# Patient Record
Sex: Male | Born: 1950
Health system: Southern US, Community
[De-identification: ages and names within clinical notes are randomized; demographics above are authoritative.]

## PROBLEM LIST (undated history)

## (undated) DIAGNOSIS — R011 Cardiac murmur, unspecified: Secondary | ICD-10-CM

## (undated) DIAGNOSIS — I639 Cerebral infarction, unspecified: Secondary | ICD-10-CM

## (undated) DIAGNOSIS — N200 Calculus of kidney: Secondary | ICD-10-CM

## (undated) DIAGNOSIS — T7840XA Allergy, unspecified, initial encounter: Secondary | ICD-10-CM

## (undated) DIAGNOSIS — I10 Essential (primary) hypertension: Secondary | ICD-10-CM

## (undated) DIAGNOSIS — M1990 Unspecified osteoarthritis, unspecified site: Secondary | ICD-10-CM

## (undated) DIAGNOSIS — E785 Hyperlipidemia, unspecified: Secondary | ICD-10-CM

## (undated) HISTORY — PX: SPINE SURGERY: SHX786

## (undated) HISTORY — DX: Cerebral infarction, unspecified: I63.9

## (undated) HISTORY — DX: Calculus of kidney: N20.0

## (undated) HISTORY — DX: Cardiac murmur, unspecified: R01.1

## (undated) HISTORY — DX: Hyperlipidemia, unspecified: E78.5

## (undated) HISTORY — DX: Essential (primary) hypertension: I10

## (undated) HISTORY — DX: Unspecified osteoarthritis, unspecified site: M19.90

## (undated) HISTORY — DX: Allergy, unspecified, initial encounter: T78.40XA

---

## 1974-07-26 HISTORY — PX: APPENDECTOMY: SHX54

## 1988-07-26 HISTORY — PX: WRIST FRACTURE SURGERY: SHX121

## 2000-03-15 ENCOUNTER — Ambulatory Visit (HOSPITAL_BASED_OUTPATIENT_CLINIC_OR_DEPARTMENT_OTHER): Admission: RE | Admit: 2000-03-15 | Discharge: 2000-03-15 | Payer: Self-pay | Admitting: Orthopedic Surgery

## 2003-05-27 ENCOUNTER — Ambulatory Visit (HOSPITAL_COMMUNITY): Admission: RE | Admit: 2003-05-27 | Discharge: 2003-05-28 | Payer: Self-pay | Admitting: Neurosurgery

## 2006-07-26 DIAGNOSIS — I639 Cerebral infarction, unspecified: Secondary | ICD-10-CM

## 2006-07-26 HISTORY — DX: Cerebral infarction, unspecified: I63.9

## 2014-11-12 ENCOUNTER — Ambulatory Visit (INDEPENDENT_AMBULATORY_CARE_PROVIDER_SITE_OTHER): Payer: Medicare Other | Admitting: Family Medicine

## 2014-11-12 ENCOUNTER — Ambulatory Visit (INDEPENDENT_AMBULATORY_CARE_PROVIDER_SITE_OTHER): Payer: Medicare Other

## 2014-11-12 VITALS — BP 226/120 | HR 66 | Temp 98.1°F | Resp 20 | Ht 66.5 in | Wt 131.4 lb

## 2014-11-12 DIAGNOSIS — Z720 Tobacco use: Secondary | ICD-10-CM | POA: Diagnosis not present

## 2014-11-12 DIAGNOSIS — R42 Dizziness and giddiness: Secondary | ICD-10-CM | POA: Diagnosis not present

## 2014-11-12 DIAGNOSIS — I10 Essential (primary) hypertension: Secondary | ICD-10-CM

## 2014-11-12 DIAGNOSIS — Z8673 Personal history of transient ischemic attack (TIA), and cerebral infarction without residual deficits: Secondary | ICD-10-CM | POA: Diagnosis not present

## 2014-11-12 DIAGNOSIS — F17200 Nicotine dependence, unspecified, uncomplicated: Secondary | ICD-10-CM | POA: Insufficient documentation

## 2014-11-12 DIAGNOSIS — M720 Palmar fascial fibromatosis [Dupuytren]: Secondary | ICD-10-CM | POA: Insufficient documentation

## 2014-11-12 LAB — POCT UA - MICROSCOPIC ONLY
Casts, Ur, LPF, POC: NEGATIVE
Crystals, Ur, HPF, POC: NEGATIVE
Mucus, UA: NEGATIVE
Yeast, UA: NEGATIVE

## 2014-11-12 LAB — POCT URINALYSIS DIPSTICK
Bilirubin, UA: NEGATIVE
Glucose, UA: NEGATIVE
Ketones, UA: NEGATIVE
Leukocytes, UA: NEGATIVE
Nitrite, UA: NEGATIVE
Protein, UA: 100
Spec Grav, UA: 1.02
Urobilinogen, UA: 0.2
pH, UA: 7

## 2014-11-12 MED ORDER — LISINOPRIL-HYDROCHLOROTHIAZIDE 20-12.5 MG PO TABS: 1.0000 | ORAL_TABLET | Freq: Every day | ORAL | Status: DC

## 2014-11-12 MED ORDER — AMLODIPINE BESYLATE 5 MG PO TABS: 5.0000 mg | ORAL_TABLET | Freq: Every day | ORAL | Status: DC

## 2014-11-12 NOTE — Patient Instructions (Addendum)
Take amlodipine one every evening  Take losartan HCT one this evening and then every morning  Monitor your blood pressure. If not less than 180/100 by Friday or Saturday please return to get rechecked here. If it is staying less than 180/100 you can wait and see me on Monday  In the event of chest pain, shortness of breath, more dizziness, or other major symptoms go straight to the emergency room or call 911

## 2014-11-12 NOTE — Progress Notes (Signed)
Subjective: 64 year old man who is here because he's been monitoring his blood pressure on outpatient basis and has been getting numbers over 200 and over 110. He has been running high blood pressures for a long time with systolic greater than 863. He does not know when he got anything lower in a long time. He has a history of having had high blood pressure and a little stroke about 8 years ago. He was hospitalized in [facility]. He lost his job and insurance so he quit taking his medicine. He never followed up and has ignored this for long time. He smokes about a pack cigarettes a day. He does not have any history of diabetes. He his not been having chest pains. He does have some shortness of breath. He works regularly. Results for orders placed or performed in visit on 11/12/14  POCT UA - Microscopic Only  Result Value Ref Range   WBC, Ur, HPF, POC 3-5    RBC, urine, microscopic 8-12    Bacteria, U Microscopic trace    Mucus, UA neg    Epithelial cells, urine per micros 1-3    Crystals, Ur, HPF, POC neg    Casts, Ur, LPF, POC neg    Yeast, UA neg   POCT urinalysis dipstick  Result Value Ref Range   Color, UA yellow    Clarity, UA clear    Glucose, UA neg    Bilirubin, UA neg    Ketones, UA neg    Spec Grav, UA 1.020    Blood, UA moderate    pH, UA 7.0    Protein, UA 100    Urobilinogen, UA 0.2    Nitrite, UA neg    Leukocytes, UA Negative    [facility] reading (PRIMARY) by  Dr. Linna Darner Possible COPD with long chest silhouette. Normal cardiac size  EKG no acute changes  Treat with amlodipine and lisinopril HCT  Long discussion about quitting smoking. Marland Kitchen

## 2014-11-13 ENCOUNTER — Encounter: Payer: Self-pay | Admitting: Family Medicine

## 2014-11-13 LAB — TSH: TSH: 0.999 u[IU]/mL (ref 0.350–4.500)

## 2014-11-13 LAB — COMPREHENSIVE METABOLIC PANEL
ALT: 18 U/L (ref 0–53)
AST: 20 U/L (ref 0–37)
Albumin: 4.4 g/dL (ref 3.5–5.2)
Alkaline Phosphatase: 61 U/L (ref 39–117)
BUN: 19 mg/dL (ref 6–23)
CO2: 26 mEq/L (ref 19–32)
Calcium: 10.3 mg/dL (ref 8.4–10.5)
Chloride: 105 mEq/L (ref 96–112)
Creat: 1.22 mg/dL (ref 0.50–1.35)
Glucose, Bld: 93 mg/dL (ref 70–99)
Potassium: 4.4 mEq/L (ref 3.5–5.3)
Sodium: 137 mEq/L (ref 135–145)
Total Bilirubin: 1 mg/dL (ref 0.2–1.2)
Total Protein: 7.2 g/dL (ref 6.0–8.3)

## 2014-11-13 LAB — LIPID PANEL
Cholesterol: 205 mg/dL — ABNORMAL HIGH (ref 0–200)
HDL: 41 mg/dL (ref >=40)
LDL Cholesterol: 133 mg/dL — ABNORMAL HIGH (ref 0–99)
Total CHOL/HDL Ratio: 5 Ratio
Triglycerides: 153 mg/dL — ABNORMAL HIGH (ref <150)
VLDL: 31 mg/dL (ref 0–40)

## 2014-11-16 ENCOUNTER — Ambulatory Visit (INDEPENDENT_AMBULATORY_CARE_PROVIDER_SITE_OTHER): Payer: Medicare Other | Admitting: Family Medicine

## 2014-11-16 VITALS — BP 168/88 | HR 57 | Temp 98.2°F | Resp 16 | Ht 68.0 in | Wt 131.8 lb

## 2014-11-16 DIAGNOSIS — M6530 Trigger finger, unspecified finger: Secondary | ICD-10-CM | POA: Diagnosis not present

## 2014-11-16 DIAGNOSIS — I10 Essential (primary) hypertension: Secondary | ICD-10-CM | POA: Diagnosis not present

## 2014-11-16 NOTE — Patient Instructions (Signed)
Hypertension Hypertension, commonly called high blood pressure, is when the force of blood pumping through your arteries is too strong. Your arteries are the blood vessels that carry blood from your heart throughout your body. A blood pressure reading consists of a higher number over a lower number, such as 110/72. The higher number (systolic) is the pressure inside your arteries when your heart pumps. The lower number (diastolic) is the pressure inside your arteries when your heart relaxes. Ideally you want your blood pressure below 120/80. Hypertension forces your heart to work harder to pump blood. Your arteries may become narrow or stiff. Having hypertension puts you at risk for heart disease, stroke, and other problems.  RISK FACTORS Some risk factors for high blood pressure are controllable. Others are not.  Risk factors you cannot control include:   Race. You may be at higher risk if you are African American.  Age. Risk increases with age.  Gender. Men are at higher risk than women before age 45 years. After age 65, women are at higher risk than men. Risk factors you can control include:  Not getting enough exercise or physical activity.  Being overweight.  Getting too much fat, sugar, calories, or salt in your diet.  Drinking too much alcohol. SIGNS AND SYMPTOMS Hypertension does not usually cause signs or symptoms. Extremely high blood pressure (hypertensive crisis) may cause headache, anxiety, shortness of breath, and nosebleed. DIAGNOSIS  To check if you have hypertension, your health care provider will measure your blood pressure while you are seated, with your arm held at the level of your heart. It should be measured at least twice using the same arm. Certain conditions can cause a difference in blood pressure between your right and left arms. A blood pressure reading that is higher than normal on one occasion does not mean that you need treatment. If one blood pressure reading  is high, ask your health care provider about having it checked again. TREATMENT  Treating high blood pressure includes making lifestyle changes and possibly taking medicine. Living a healthy lifestyle can help lower high blood pressure. You may need to change some of your habits. Lifestyle changes may include:  Following the DASH diet. This diet is high in fruits, vegetables, and whole grains. It is low in salt, red meat, and added sugars.  Getting at least 2 hours of brisk physical activity every week.  Losing weight if necessary.  Not smoking.  Limiting alcoholic beverages.  Learning ways to reduce stress. If lifestyle changes are not enough to get your blood pressure under control, your health care provider may prescribe medicine. You may need to take more than one. Work closely with your health care provider to understand the risks and benefits. HOME CARE INSTRUCTIONS  Have your blood pressure rechecked as directed by your health care provider.   Take medicines only as directed by your health care provider. Follow the directions carefully. Blood pressure medicines must be taken as prescribed. The medicine does not work as well when you skip doses. Skipping doses also puts you at risk for problems.   Do not smoke.   Monitor your blood pressure at home as directed by your health care provider. SEEK MEDICAL CARE IF:   You think you are having a reaction to medicines taken.  You have recurrent headaches or feel dizzy.  You have swelling in your ankles.  You have trouble with your vision. SEEK IMMEDIATE MEDICAL CARE IF:  You develop a severe headache or confusion.    You have unusual weakness, numbness, or feel faint.  You have severe chest or abdominal pain.  You vomit repeatedly.  You have trouble breathing. MAKE SURE YOU:   Understand these instructions.  Will watch your condition.  Will get help right away if you are not doing well or get worse. Document  Released: 07/12/2005 Document Revised: 11/26/2013 Document Reviewed: 05/04/2013 [employer] Patient Information 2015 [place], [place]. This information is not intended to replace advice given to you by your health care provider. Make sure you discuss any questions you have with your health care provider. Trigger Finger Trigger finger (digital tendinitis and stenosing tenosynovitis) is a common disorder that causes an often painful catching of the fingers or thumb. It occurs as a clicking, snapping, or locking of a finger in the palm of the hand. This is caused by a problem with the tendons that flex or bend the fingers sliding smoothly through their sheaths. The condition may occur in any finger or a couple fingers at the same time.  The finger may lock with the finger curled or suddenly straighten out with a snap. This is more common in patients with rheumatoid arthritis and diabetes. Left untreated, the condition may get worse to the point where the finger becomes locked in flexion, like making a fist, or less commonly locked with the finger straightened out. CAUSES   Inflammation and scarring that lead to swelling around the tendon sheath.  Repeated or forceful movements.  Rheumatoid arthritis, an autoimmune disease that affects joints.  Gout.  Diabetes mellitus. SIGNS AND SYMPTOMS  Soreness and swelling of your finger.  A painful clicking or snapping as you bend and straighten your finger. DIAGNOSIS  Your health care provider will do a physical exam of your finger to diagnose trigger finger. TREATMENT   Splinting for 6-8 weeks may be helpful.  Nonsteroidal anti-inflammatory medicines (NSAIDs) can help to relieve the pain and inflammation.  Cortisone injections, along with splinting, may speed up recovery. Several injections may be required. Cortisone may give relief after one injection.  Surgery is another treatment that may be used if conservative treatments do not work. Surgery can  be minor, without incisions (a cut does not have to be made), and can be done with a needle through the skin.  Other surgical choices involve an open procedure in which the surgeon opens the hand through a small incision and cuts the pulley so the tendon can again slide smoothly. Your hand will still work fine. HOME CARE INSTRUCTIONS  Apply ice to the injured area, twice per day:  Put ice in a plastic bag.  Place a towel between your skin and the bag.  Leave the ice on for 20 minutes, 3-4 times a day.  Rest your hand often. MAKE SURE YOU:   Understand these instructions.  Will watch your condition.  Will get help right away if you are not doing well or get worse. Document Released: 05/01/2004 Document Revised: 03/14/2013 Document Reviewed: 12/12/2012 [facility] Patient Information 2015 [place], [place]. This information is not intended to replace advice given to you by your health care provider. Make sure you discuss any questions you have with your health care provider.

## 2014-11-16 NOTE — Progress Notes (Signed)
Patient ID: Jeffery Taylor MRN: [id number], DOB: 07/16/1951, 64 y.o. Date of Encounter: 11/16/2014, 9:36 AM  Primary Physician: No PCP Per Patient  Chief Complaint: HTN  HPI: 64 y.o. year old male with history below presents for hypertension follow up.  His blood pressure was elevated on Tuesday and he was started on blood pressure medication He has a h/o stroke from which he recovered He works in [employer] and hopes to retire in a year He also has hand problem with left middle finger trigger finger.  No CP, HA, visual changes, or focal deficits.   Past Medical History  Diagnosis Date  . Stroke   . Hypertension      Home Meds: Prior to Admission medications   Medication Sig Start Date End Date Taking? Authorizing Provider  amLODipine (NORVASC) 5 MG tablet Take 1 tablet (5 mg total) by mouth daily. 11/12/14  Yes Posey Boyer, MD  lisinopril-hydrochlorothiazide (ZESTORETIC) 20-12.5 MG per tablet Take 1 tablet by mouth daily. 11/12/14  Yes Posey Boyer, MD    Allergies: No Known Allergies  History   Social History  . Marital Status: Married    Spouse Name: N/A  . Number of Children: N/A  . Years of Education: N/A   Occupational History  . Not on file.   Social History Main Topics  . Smoking status: Current Every Day Smoker -- 1.00 packs/day    Types: Cigarettes  . Smokeless tobacco: Never Used  . Alcohol Use: No  . Drug Use: No  . Sexual Activity: Not on file   Other Topics Concern  . Not on file   Social History Narrative     History reviewed. No pertinent family history.  Review of Systems: Constitutional: negative for chills, fever, night sweats, weight changes, or fatigue  HEENT: negative for vision changes, hearing loss, congestion, rhinorrhea, ST, epistaxis, or sinus pressure Cardiovascular: negative for chest pain, palpitations, or DOE Respiratory: negative for hemoptysis, wheezing, shortness of breath, or cough Abdominal: negative for  abdominal pain, nausea, vomiting, diarrhea, or constipation Dermatological: negative for rash Neurologic: negative for headache, dizziness, or syncope All other systems reviewed and are otherwise negative with the exception to those above and in the HPI.   Physical Exam: Blood pressure 168/88, pulse 57, temperature 98.2 F (36.8 C), temperature source Oral, resp. rate 16, height 5\' 8"  (1.727 m), weight 131 lb 12.8 oz (59.784 kg), SpO2 99 %., Body mass index is 20.04 kg/(m^2). BP Readings from Last 3 Encounters:  11/16/14 168/88  11/12/14 226/120   Wt Readings from Last 3 Encounters:  11/16/14 131 lb 12.8 oz (59.784 kg)  11/12/14 131 lb 6 oz (59.591 kg)   General: Well developed, well nourished, in no acute distress.  BP recheck 158/88 Head: Normocephalic, atraumatic, eyes without discharge, sclera non-icteric, nares are without discharge. Bilateral auditory canals clear, TM's are without perforation, pearly grey and translucent with reflective cone of light bilaterally. Oral cavity moist, posterior pharynx without exudate, erythema, peritonsillar abscess, or post nasal drip.  Neck: Supple. No thyromegaly. Full ROM. No lymphadenopathy. No carotid bruits. Lungs: Clear bilaterally to auscultation without wheezes, rales, or rhonchi. Breathing is unlabored. Heart: RRR with S1 S2. No murmurs, rubs, or gallops appreciated.  Abdomen: Soft, non-tender, non-distended with normoactive bowel sounds. No hepatosplenomegaly. No rebound/guarding. No obvious abdominal masses. Msk:  Strength and tone normal for age.  Trigger finger right middle finger. Extremities/Skin: Warm and dry. No clubbing or cyanosis. No edema. No rashes or suspicious lesions.  Distal pulses 2+ and equal bilaterally. Neuro: Alert and oriented X 3. Moves all extremities spontaneously. Gait is normal. CNII-XII grossly in tact. DTR 2+, cerebellar function intact. Rhomberg normal. Psych:  Responds to questions appropriately with a normal  affect.   ASSESSMENT AND PLAN:  64 y.o. year old male with hypertension and trigger finger Continue the current medications Ortho referral for trigger finger -  Signed, Robyn Haber, MD 11/16/2014 9:36 AM

## 2014-11-29 ENCOUNTER — Ambulatory Visit (INDEPENDENT_AMBULATORY_CARE_PROVIDER_SITE_OTHER): Payer: Medicare Other | Admitting: Physician Assistant

## 2014-11-29 VITALS — BP 190/110 | HR 77 | Temp 98.3°F | Resp 16 | Ht 68.0 in | Wt 132.8 lb

## 2014-11-29 DIAGNOSIS — M6530 Trigger finger, unspecified finger: Secondary | ICD-10-CM | POA: Diagnosis not present

## 2014-11-29 DIAGNOSIS — I10 Essential (primary) hypertension: Secondary | ICD-10-CM | POA: Diagnosis not present

## 2014-11-29 NOTE — Progress Notes (Signed)
Procedure note: Informed consent obtained After demonstrating the triggering phenomenon of the middle finger left hand at the palmar aspect of the flexor tendon close to the MCP, sterile field was obtained and he was injected with half cc 2% lidocaine plus quarter cc D 80 The area was massaged and he was buddy taped to finger #4 Postop care was described He will be referred to orthopedics for probable surgery/definitive treatment

## 2014-11-29 NOTE — Progress Notes (Signed)
   Subjective:    Patient ID: Jeffery Taylor, male    DOB: September 13, 1950, 64 y.o.   MRN: [id number]  Chief Complaint  Patient presents with  . Hand Pain    Left   Patient Active Problem List   Diagnosis Date Noted  . Malignant hypertension 11/12/2014  . Tobacco use disorder 11/12/2014  . Dupuytren's contracture of left hand 11/12/2014  . History of stroke 11/12/2014   Prior to Admission medications   Medication Sig Start Date End Date Taking? Authorizing Provider  amLODipine (NORVASC) 5 MG tablet Take 1 tablet (5 mg total) by mouth daily. 11/12/14  Yes Posey Boyer, MD  lisinopril-hydrochlorothiazide (ZESTORETIC) 20-12.5 MG per tablet Take 1 tablet by mouth daily. 11/12/14  Yes Posey Boyer, MD   No chest pain, SOB, HA, dizziness, vision change, N/V, diarrhea, constipation, dysuria, urinary urgency or frequency, myalgias, arthralgias or rash.  HPI  7 yom with pmh htn and left middle finger trigger finger presents with recurrence of trigger finger.   BP elevated today at 190/110. Was seen here 11/12/14 bp elevated 226/120. Started on amlodipine 5 and lisinopril/hctz 20/12.5. Was seen again 11/16/14, bp better controlled at 168/88 then retaken 158/88. Today states has been taking both his meds as prescribed. Checks bp at home typically running 150s/80s. Denies cp, sob, palps, presyncope, syncope, vision changes, persistent HA.   Has hx trigger finger several fingers left hand. Has had injections in past. When was here 11/16/14 Dr L referred him to ortho for his trigger finger. Notes show that ortho tried to conatct him but his phone wasn't set up. He is still interested in the referral because his left middle finger continues to lock, approx 15 time per day. He buddy tapes his fingers at home.   Review of Systems See HPI.     Objective:   Physical Exam  Constitutional: He is oriented to person, place, and time. He appears well-developed and well-nourished.  Non-toxic appearance. He does  not have a sickly appearance. He does not appear ill. No distress.  BP 190/110 mmHg  Pulse 77  Temp(Src) 98.3 F (36.8 C) (Oral)  Resp 16  Ht 5\' 8"  (1.727 m)  Wt 132 lb 12.8 oz (60.238 kg)  BMI 20.20 kg/m2  SpO2 98%   Musculoskeletal:       Hands: Inflammation noted mcp joint left middle digit. Tendon catching palpable at this joint with flexion. Normal sensation, normal cap refill.   Neurological: He is alert and oriented to person, place, and time.      Assessment & Plan:   18 yom with pmh htn and left middle finger trigger finger presents with recurrence of trigger finger.   Trigger finger, acquired --1cc injection steroid/lido into tendon area as per procedure note --pt will be re referred to ortho with proper phone number --buddy taped today, leave for 2-3 days, ice few times day next few days, tylenol prn  Essential hypertension --continues to be elevated --increase amlodipine from 5 to 10 mg qd, will refill at 10 mg dose --instructed to rtc if bp at home running above 150/90   Julieta Gutting, PA-C Physician Assistant-Certified [facility]  11/29/2014 9:48 AM

## 2014-11-29 NOTE — Patient Instructions (Signed)
Your blood pressure was still elevated today. Please start taking the amlodipine 10 mg every morning along with your afternoon blood pressure medication.  When you need refills of the amlodipine we will likely fill 10 mg at a time.  If your blood pressure continues to run greater than 150/90 please let us know. For your trigger finger, please avoid NSAIDs and take tylenol for the pain.  We will have ortho contact your correct number for a possible appointment. You may require surgery for this in the future.  Keep the buddy taping on for the next few days. Apply ice a few times a day for the next few days.   Hypertension Hypertension, commonly called high blood pressure, is when the force of blood pumping through your arteries is too strong. Your arteries are the blood vessels that carry blood from your heart throughout your body. A blood pressure reading consists of a higher number over a lower number, such as 110/72. The higher number (systolic) is the pressure inside your arteries when your heart pumps. The lower number (diastolic) is the pressure inside your arteries when your heart relaxes. Ideally you want your blood pressure below 120/80. Hypertension forces your heart to work harder to pump blood. Your arteries may become narrow or stiff. Having hypertension puts you at risk for heart disease, stroke, and other problems.  RISK FACTORS Some risk factors for high blood pressure are controllable. Others are not.  Risk factors you cannot control include:   Race. You may be at higher risk if you are African American.  Age. Risk increases with age.  Gender. Men are at higher risk than women before age 110 years. After age 34, women are at higher risk than men. Risk factors you can control include:  Not getting enough exercise or physical activity.  Being overweight.  Getting too much fat, sugar, calories, or salt in your diet.  Drinking too much alcohol. SIGNS AND SYMPTOMS Hypertension  does not usually cause signs or symptoms. Extremely high blood pressure (hypertensive crisis) may cause headache, anxiety, shortness of breath, and nosebleed. DIAGNOSIS  To check if you have hypertension, your health care provider will measure your blood pressure while you are seated, with your arm held at the level of your heart. It should be measured at least twice using the same arm. Certain conditions can cause a difference in blood pressure between your right and left arms. A blood pressure reading that is higher than normal on one occasion does not mean that you need treatment. If one blood pressure reading is high, ask your health care provider about having it checked again. TREATMENT  Treating high blood pressure includes making lifestyle changes and possibly taking medicine. Living a healthy lifestyle can help lower high blood pressure. You may need to change some of your habits. Lifestyle changes may include:  Following the DASH diet. This diet is high in fruits, vegetables, and whole grains. It is low in salt, red meat, and added sugars.  Getting at least 2 hours of brisk physical activity every week.  Losing weight if necessary.  Not smoking.  Limiting alcoholic beverages.  Learning ways to reduce stress. If lifestyle changes are not enough to get your blood pressure under control, your health care provider may prescribe medicine. You may need to take more than one. Work closely with your health care provider to understand the risks and benefits. HOME CARE INSTRUCTIONS  Have your blood pressure rechecked as directed by your health care provider.  Take medicines only as directed by your health care provider. Follow the directions carefully. Blood pressure medicines must be taken as prescribed. The medicine does not work as well when you skip doses. Skipping doses also puts you at risk for problems.   Do not smoke.   Monitor your blood pressure at home as directed by your  health care provider. SEEK MEDICAL CARE IF:   You think you are having a reaction to medicines taken.  You have recurrent headaches or feel dizzy.  You have swelling in your ankles.  You have trouble with your vision. SEEK IMMEDIATE MEDICAL CARE IF:  You develop a severe headache or confusion.  You have unusual weakness, numbness, or feel faint.  You have severe chest or abdominal pain.  You vomit repeatedly.  You have trouble breathing. MAKE SURE YOU:   Understand these instructions.  Will watch your condition.  Will get help right away if you are not doing well or get worse. Document Released: 07/12/2005 Document Revised: 11/26/2013 Document Reviewed: 05/04/2013 [facility] Patient Information 2015 [place], [place]. This information is not intended to replace advice given to you by your health care provider. Make sure you discuss any questions you have with your health care provider.    Trigger Finger Trigger finger (digital tendinitis and stenosing tenosynovitis) is a common disorder that causes an often painful catching of the fingers or thumb. It occurs as a clicking, snapping, or locking of a finger in the palm of the hand. This is caused by a problem with the tendons that flex or bend the fingers sliding smoothly through their sheaths. The condition may occur in any finger or a couple fingers at the same time.  The finger may lock with the finger curled or suddenly straighten out with a snap. This is more common in patients with rheumatoid arthritis and diabetes. Left untreated, the condition may get worse to the point where the finger becomes locked in flexion, like making a fist, or less commonly locked with the finger straightened out. CAUSES   Inflammation and scarring that lead to swelling around the tendon sheath.  Repeated or forceful movements.  Rheumatoid arthritis, an autoimmune disease that affects joints.  Gout.  Diabetes mellitus. SIGNS AND  SYMPTOMS  Soreness and swelling of your finger.  A painful clicking or snapping as you bend and straighten your finger. DIAGNOSIS  Your health care provider will do a physical exam of your finger to diagnose trigger finger. TREATMENT   Splinting for 6-8 weeks may be helpful.  Nonsteroidal anti-inflammatory medicines (NSAIDs) can help to relieve the pain and inflammation.  Cortisone injections, along with splinting, may speed up recovery. Several injections may be required. Cortisone may give relief after one injection.  Surgery is another treatment that may be used if conservative treatments do not work. Surgery can be minor, without incisions (a cut does not have to be made), and can be done with a needle through the skin.  Other surgical choices involve an open procedure in which the surgeon opens the hand through a small incision and cuts the pulley so the tendon can again slide smoothly. Your hand will still work fine. HOME CARE INSTRUCTIONS  Apply ice to the injured area, twice per day:  Put ice in a plastic bag.  Place a towel between your skin and the bag.  Leave the ice on for 20 minutes, 3-4 times a day.  Rest your hand often. MAKE SURE YOU:   Understand these instructions.  Will  watch your condition.  Will get help right away if you are not doing well or get worse. Document Released: 05/01/2004 Document Revised: 03/14/2013 Document Reviewed: 12/12/2012 [facility] Patient Information 2015 [place], [place]. This information is not intended to replace advice given to you by your health care provider. Make sure you discuss any questions you have with your health care provider.

## 2015-01-16 ENCOUNTER — Ambulatory Visit: Payer: Self-pay | Admitting: Primary Care

## 2015-01-17 ENCOUNTER — Encounter: Payer: Self-pay | Admitting: Primary Care

## 2015-01-17 ENCOUNTER — Ambulatory Visit (INDEPENDENT_AMBULATORY_CARE_PROVIDER_SITE_OTHER): Payer: Medicare Other | Admitting: Primary Care

## 2015-01-17 ENCOUNTER — Telehealth: Payer: Self-pay | Admitting: *Deleted

## 2015-01-17 VITALS — BP 148/84 | HR 64 | Temp 98.0°F | Ht 68.0 in | Wt 131.8 lb

## 2015-01-17 DIAGNOSIS — I10 Essential (primary) hypertension: Secondary | ICD-10-CM | POA: Diagnosis not present

## 2015-01-17 DIAGNOSIS — Z8673 Personal history of transient ischemic attack (TIA), and cerebral infarction without residual deficits: Secondary | ICD-10-CM | POA: Diagnosis not present

## 2015-01-17 DIAGNOSIS — R202 Paresthesia of skin: Secondary | ICD-10-CM

## 2015-01-17 DIAGNOSIS — R200 Anesthesia of skin: Secondary | ICD-10-CM

## 2015-01-17 DIAGNOSIS — G8929 Other chronic pain: Secondary | ICD-10-CM

## 2015-01-17 DIAGNOSIS — M542 Cervicalgia: Secondary | ICD-10-CM

## 2015-01-17 MED ORDER — LISINOPRIL-HYDROCHLOROTHIAZIDE 20-25 MG PO TABS: 1.0000 | ORAL_TABLET | Freq: Every day | ORAL | Status: DC

## 2015-01-17 MED ORDER — AMLODIPINE BESYLATE 10 MG PO TABS: 10.0000 mg | ORAL_TABLET | Freq: Every day | ORAL | Status: DC

## 2015-01-17 NOTE — Progress Notes (Signed)
Pre visit review using our clinic review tool, if applicable. No additional management support is needed unless otherwise documented below in the visit note. 

## 2015-01-17 NOTE — Assessment & Plan Note (Signed)
No follow up with neurology in 20+ years. Gradual increase in numbness/tingling to bilateral upper extremities over the years. Referral to neurology for further evaluation/ EMG testing and for general follow up. Could also be related to neck pain.

## 2015-01-17 NOTE — Patient Instructions (Addendum)
I have sent the Amlodipine 10 mg tablets to your pharmacy. Continue your lisinopril/HCTZ.  You will be contacted regarding your referral to Neurology.  Please let us know if you have not heard back within one week.   Please schedule a physical with me in the next 3 months. You will also schedule a lab only appointment one week prior. We will discuss your lab results during your physical.  It was a pleasure to meet you today! Please don't hesitate to call me with any questions. Welcome to [facility]!

## 2015-01-17 NOTE — Assessment & Plan Note (Signed)
Surgery in 2004 due to degenerative discs. Numbness/tingling to upper extremities has increased over years. Suspect this could be related to neck issues. Referral to neurology for initial eval.

## 2015-01-17 NOTE — Telephone Encounter (Signed)
Received call from patient that amlodipine is expense. Patient have to pay out of pocket. Would it be ok to prescribed medication of [facility] $4 prescriptions program.

## 2015-01-17 NOTE — Progress Notes (Signed)
Subjective:    Patient ID: Jeffery Taylor, male    DOB: [birth year], 64 y.o.   MRN: [id number]  HPI  Jeffery Taylor is a 64 year old male who presents today to establish care and discuss the problems mentioned below. Will obtain old records.  1) Hypertension: He is managed on lisinopril/HCTZ 20/12.5 mg and Amlodipine 5 mg. He was last evaluated at Urgent Care in early May for his blood pressure which was noted to be elevated. They instructed him to increase his Amlodipine to 10 mg daily, so he doubled up on his 5mg  tablets and was never provided with a prescription for 10 mg. He's been checking his blood pressure at [facility] and will get high 140's/80's. Denies headaches, chest pain.   2) Neck pain: Surgery completed in 2004 due to degenerative discs. He does report numbness/tingling to bilateral extremities since his surgery. Over the years his numbness/tingling has gradually become worse. He's also experiencing constant neck pain to the location of the metal plates in his neck. He takes tylenol arthritis with some relief.   3) Stroke: Occurred in 2008 to left side. No residual symptoms. Denies weakness, dizziness. He's not followed with his neurologist in over 20 years. Denies slurred speech, confusion, unilateral weakness.  Review of Systems  Constitutional: Negative for unexpected weight change.  HENT: Negative for rhinorrhea.   Respiratory: Negative for cough and shortness of breath.   Cardiovascular: Negative for chest pain.  Gastrointestinal: Negative for diarrhea and constipation.  Genitourinary: Negative for difficulty urinating.  Musculoskeletal: Positive for arthralgias and neck pain.  Skin: Negative for rash.  Allergic/Immunologic: Negative for environmental allergies.  Neurological: Negative for dizziness and headaches.  Psychiatric/Behavioral:       Denies concerns for anxiety or depression       Past Medical History  Diagnosis Date  . Stroke   . Hypertension   . Kidney  stones     History   Social History  . Marital Status: Married    Spouse Name: N/A  . Number of Children: N/A  . Years of Education: N/A   Occupational History  . Not on file.   Social History Main Topics  . Smoking status: Current Every Day Smoker -- 1.00 packs/day    Types: Cigarettes  . Smokeless tobacco: Never Used  . Alcohol Use: No  . Drug Use: No  . Sexual Activity: Not on file   Other Topics Concern  . Not on file   Social History Narrative   Single. In a relationship.   Works for a [employer], technical sales.   Highest level of education High School.   Enjoys golfing, walking, collecting [place] artifacts.       Past Surgical History  Procedure Laterality Date  . Spine surgery    . Fracture surgery    . Appendectomy  1976    History reviewed. No pertinent family history.  No Known Allergies  Current Outpatient Prescriptions on File Prior to Visit  Medication Sig Dispense Refill  . lisinopril-hydrochlorothiazide (ZESTORETIC) 20-12.5 MG per tablet Take 1 tablet by mouth daily. 90 tablet 3   No current facility-administered medications on file prior to visit.    BP 148/84 mmHg  Pulse 64  Temp(Src) 98 F (36.7 C) (Oral)  Ht 5\' 8"  (1.727 m)  Wt 131 lb 12.8 oz (59.784 kg)  BMI 20.04 kg/m2  SpO2 98%    Objective:   Physical Exam  Constitutional: He is oriented to person, place, and time. He appears well-nourished.  Eyes: Conjunctivae are normal. Pupils are equal, round, and reactive to light.  Neck: Spinous process tenderness present. Normal range of motion present.  Cardiovascular: Normal rate and regular rhythm.   Pulmonary/Chest: Effort normal and breath sounds normal.  Neurological: He is alert and oriented to person, place, and time. He has normal strength. No cranial nerve deficit.  Grips equal bilaterally, no facial drooping. Decreased sensation to bilateral upper extremities which is not an acute change.  Skin: Skin is warm and dry.  Psychiatric:  He has a normal mood and affect.          Assessment & Plan:

## 2015-01-17 NOTE — Assessment & Plan Note (Addendum)
Managed on lisinopril/HCTZ 20/12.5 and Amlodipine 10 mg. Sent refills for Amlodipine 10 mg as he only had the 5 mg tablets. Continue same. BP stable per national guidelines. Will continue to monitor.

## 2015-01-17 NOTE — Telephone Encounter (Signed)
Increased lisinopril/HCTZ from 20/12.5 to 20/25. He is to stop the amlodipine tablets and stop the lisinopril/HCTZ 20/12.5. Start lisinopril/HCTZ 20/25. Left voicemail with detailed message.  We will need to see him for follow up in 2-3 weeks. Vallarie Mare, will you ensure he received this message and schedule him for follow up? Thanks!

## 2015-01-21 NOTE — Telephone Encounter (Signed)
Called and notified patient of Kate's comments. Patient verbalized understanding. Patient will call back later to schedule follow up.

## 2015-02-14 ENCOUNTER — Encounter: Payer: Self-pay | Admitting: Primary Care

## 2015-02-14 ENCOUNTER — Ambulatory Visit (INDEPENDENT_AMBULATORY_CARE_PROVIDER_SITE_OTHER): Payer: Medicare Other | Admitting: Primary Care

## 2015-02-14 VITALS — BP 120/60 | HR 69 | Temp 97.8°F | Wt 130.0 lb

## 2015-02-14 DIAGNOSIS — I10 Essential (primary) hypertension: Secondary | ICD-10-CM | POA: Diagnosis not present

## 2015-02-14 LAB — BASIC METABOLIC PANEL
BUN: 23 mg/dL (ref 6–23)
CO2: 29 mEq/L (ref 19–32)
Calcium: 10.1 mg/dL (ref 8.4–10.5)
Chloride: 104 mEq/L (ref 96–112)
Creatinine, Ser: 1.37 mg/dL (ref 0.40–1.50)
GFR: 55.37 mL/min — ABNORMAL LOW (ref >60.00)
Glucose, Bld: 100 mg/dL — ABNORMAL HIGH (ref 70–99)
Potassium: 3.9 mEq/L (ref 3.5–5.1)
Sodium: 137 mEq/L (ref 135–145)

## 2015-02-14 NOTE — Assessment & Plan Note (Signed)
Improved on lisinopril/HCTZ 20/25mg . BP today 120/60. Denies headaches, chest pain, cough. Continue same follow up in 3 months. BMP today.

## 2015-02-14 NOTE — Patient Instructions (Signed)
Complete lab work prior to leaving today. I will notify you of your results.  Your blood pressure looks great! Follow up in 3 months for recheck of your cholesterol and blood pressure.  It was nice to see you!

## 2015-02-14 NOTE — Progress Notes (Signed)
   Subjective:    Patient ID: Jeffery Taylor, male    DOB: 1950-10-20, 64 y.o.   MRN: [id number]  HPI  Jeffery Taylor is a 64 year old male who presents today for follow up of hypertension. He was evaluated initially on 01/17/15 for hypertension. He was taking amlodipine 10 mg and lisinopril/HCTZ  20/12.5 mg which was prescribed by urgent care in April 2016. He had difficulty affording his amlodipine and requested an alteration. He was increased to lisinopril/HCTZ 20/25 last visit.   Since last visit he's feeling well. He denies headaches, chest pain, shortness of breath, dizziness. His blood pressure in the office today is 120/60.   Review of Systems  Respiratory: Negative for cough and shortness of breath.   Cardiovascular: Negative for chest pain.  Neurological: Negative for dizziness and headaches.       Past Medical History  Diagnosis Date  . Stroke   . Hypertension   . Kidney stones     History   Social History  . Marital Status: Married    Spouse Name: N/A  . Number of Children: N/A  . Years of Education: N/A   Occupational History  . Not on file.   Social History Main Topics  . Smoking status: Current Every Day Smoker -- 1.00 packs/day    Types: Cigarettes  . Smokeless tobacco: Never Used  . Alcohol Use: No  . Drug Use: No  . Sexual Activity: Not on file   Other Topics Concern  . Not on file   Social History Narrative   Single. In a relationship.   Works for a Engineer, technical sales.   Highest level of education High School.   Enjoys golfing, walking, collecting [place] artifacts.       Past Surgical History  Procedure Laterality Date  . Spine surgery    . Fracture surgery    . Appendectomy  1976    No family history on file.  No Known Allergies  Current Outpatient Prescriptions on File Prior to Visit  Medication Sig Dispense Refill  . lisinopril-hydrochlorothiazide (PRINZIDE,ZESTORETIC) 20-25 MG per tablet Take 1 tablet by mouth daily. 30 tablet 3   No  current facility-administered medications on file prior to visit.    BP 120/60 mmHg  Pulse 69  Temp(Src) 97.8 F (36.6 C) (Tympanic)  Wt 130 lb (58.968 kg)  SpO2 98%    Objective:   Physical Exam  Constitutional: He appears well-nourished.  Cardiovascular: Normal rate and regular rhythm.   Pulmonary/Chest: Effort normal and breath sounds normal.  Skin: Skin is warm and dry.          Assessment & Plan:

## 2015-02-14 NOTE — Progress Notes (Signed)
Pre visit review using our clinic review tool, if applicable. No additional management support is needed unless otherwise documented below in the visit note. 

## 2015-02-17 ENCOUNTER — Encounter: Payer: Self-pay | Admitting: *Deleted

## 2015-03-21 ENCOUNTER — Encounter (INDEPENDENT_AMBULATORY_CARE_PROVIDER_SITE_OTHER): Payer: Self-pay

## 2015-05-16 ENCOUNTER — Encounter: Payer: Self-pay | Admitting: Primary Care

## 2015-05-16 ENCOUNTER — Ambulatory Visit (INDEPENDENT_AMBULATORY_CARE_PROVIDER_SITE_OTHER): Payer: Medicare Other | Admitting: Primary Care

## 2015-05-16 VITALS — BP 152/90 | HR 59 | Temp 97.9°F | Ht 68.0 in | Wt 131.4 lb

## 2015-05-16 DIAGNOSIS — I10 Essential (primary) hypertension: Secondary | ICD-10-CM

## 2015-05-16 LAB — BASIC METABOLIC PANEL
BUN: 24 mg/dL — ABNORMAL HIGH (ref 6–23)
CO2: 30 mEq/L (ref 19–32)
Calcium: 11.3 mg/dL — ABNORMAL HIGH (ref 8.4–10.5)
Chloride: 103 mEq/L (ref 96–112)
Creatinine, Ser: 1.38 mg/dL (ref 0.40–1.50)
GFR: 54.87 mL/min — ABNORMAL LOW (ref >60.00)
Glucose, Bld: 87 mg/dL (ref 70–99)
Potassium: 4.8 mEq/L (ref 3.5–5.1)
Sodium: 138 mEq/L (ref 135–145)

## 2015-05-16 MED ORDER — LISINOPRIL-HYDROCHLOROTHIAZIDE 20-25 MG PO TABS: 1.0000 | ORAL_TABLET | Freq: Every day | ORAL | Status: DC

## 2015-05-16 NOTE — Addendum Note (Signed)
Addended by: Jacqualin Combes on: 05/16/2015 02:14 PM   Modules accepted: Orders

## 2015-05-16 NOTE — Patient Instructions (Addendum)
Check your blood pressure daily, around the same time of day, for the next 2 weeks.   Ensure that you have rested for 30 minutes prior to checking your blood pressure. Record your readings and call me in 2 weeks.  Complete lab work prior to leaving today. I will notify you of your results.  Please schedule a physical with me before the end of the year. You will also schedule a lab only appointment one week prior. We will discuss your lab results during your physical.  It was a pleasure to see you today!  Hypertension Hypertension, commonly called high blood pressure, is when the force of blood pumping through your arteries is too strong. Your arteries are the blood vessels that carry blood from your heart throughout your body. A blood pressure reading consists of a higher number over a lower number, such as 110/72. The higher number (systolic) is the pressure inside your arteries when your heart pumps. The lower number (diastolic) is the pressure inside your arteries when your heart relaxes. Ideally you want your blood pressure below 120/80. Hypertension forces your heart to work harder to pump blood. Your arteries may become narrow or stiff. Having untreated or uncontrolled hypertension can cause heart attack, stroke, kidney disease, and other problems. RISK FACTORS Some risk factors for high blood pressure are controllable. Others are not.  Risk factors you cannot control include:   Race. You may be at higher risk if you are African American.  Age. Risk increases with age.  Gender. Men are at higher risk than women before age 25 years. After age 57, women are at higher risk than men. Risk factors you can control include:  Not getting enough exercise or physical activity.  Being overweight.  Getting too much fat, sugar, calories, or salt in your diet.  Drinking too much alcohol. SIGNS AND SYMPTOMS Hypertension does not usually cause signs or symptoms. Extremely high blood pressure  (hypertensive crisis) may cause headache, anxiety, shortness of breath, and nosebleed. DIAGNOSIS To check if you have hypertension, your health care provider will measure your blood pressure while you are seated, with your arm held at the level of your heart. It should be measured at least twice using the same arm. Certain conditions can cause a difference in blood pressure between your right and left arms. A blood pressure reading that is higher than normal on one occasion does not mean that you need treatment. If it is not clear whether you have high blood pressure, you may be asked to return on a different day to have your blood pressure checked again. Or, you may be asked to monitor your blood pressure at home for 1 or more weeks. TREATMENT Treating high blood pressure includes making lifestyle changes and possibly taking medicine. Living a healthy lifestyle can help lower high blood pressure. You may need to change some of your habits. Lifestyle changes may include:  Following the DASH diet. This diet is high in fruits, vegetables, and whole grains. It is low in salt, red meat, and added sugars.  Keep your sodium intake below 2,300 mg per day.  Getting at least 30-45 minutes of aerobic exercise at least 4 times per week.  Losing weight if necessary.  Not smoking.  Limiting alcoholic beverages.  Learning ways to reduce stress. Your health care provider may prescribe medicine if lifestyle changes are not enough to get your blood pressure under control, and if one of the following is true:  You are 18-59 years  of age and your systolic blood pressure is above 140.  You are 17 years of age or older, and your systolic blood pressure is above 150.  Your diastolic blood pressure is above 90.  You have diabetes, and your systolic blood pressure is over 761 or your diastolic blood pressure is over 90.  You have kidney disease and your blood pressure is above 140/90.  You have heart disease  and your blood pressure is above 140/90. Your personal target blood pressure may vary depending on your medical conditions, your age, and other factors. HOME CARE INSTRUCTIONS  Have your blood pressure rechecked as directed by your health care provider.   Take medicines only as directed by your health care provider. Follow the directions carefully. Blood pressure medicines must be taken as prescribed. The medicine does not work as well when you skip doses. Skipping doses also puts you at risk for problems.  Do not smoke.   Monitor your blood pressure at home as directed by your health care provider. SEEK MEDICAL CARE IF:   You think you are having a reaction to medicines taken.  You have recurrent headaches or feel dizzy.  You have swelling in your ankles.  You have trouble with your vision. SEEK IMMEDIATE MEDICAL CARE IF:  You develop a severe headache or confusion.  You have unusual weakness, numbness, or feel faint.  You have severe chest or abdominal pain.  You vomit repeatedly.  You have trouble breathing. MAKE SURE YOU:   Understand these instructions.  Will watch your condition.  Will get help right away if you are not doing well or get worse.   This information is not intended to replace advice given to you by your health care provider. Make sure you discuss any questions you have with your health care provider.   Document Released: 07/12/2005 Document Revised: 11/26/2014 Document Reviewed: 05/04/2013 Elsevier Interactive Patient Education [employer].

## 2015-05-16 NOTE — Progress Notes (Signed)
Pre visit review using our clinic review tool, if applicable. No additional management support is needed unless otherwise documented below in the visit note. 

## 2015-05-16 NOTE — Progress Notes (Signed)
   Subjective:    Patient ID: Jeffery Taylor, male    DOB: [birth year], 64 y.o.   MRN: [id number]  HPI  Jeffery Taylor is a 64 year old male who presents today for follow up of hypertension. He is currently managed on lisinopril/HCTZ 20/25 mg that was increased from lisinopril/HCTZ 20/12.5. He was also to be taking Amlodipine but could not afford this medication. Denies chest pain, shortness of breath, headaches. He's had a dry cough mostly at night and is not bothersome. He's not checking his blood pressure at home.  BP Readings from Last 3 Encounters:  05/16/15 152/90  02/14/15 120/60  01/17/15 148/84     Review of Systems  Respiratory: Positive for cough. Negative for shortness of breath.   Cardiovascular: Negative for chest pain.  Neurological: Negative for dizziness, numbness and headaches.       Past Medical History  Diagnosis Date  . Stroke ([place])   . Hypertension   . Kidney stones     Social History   Social History  . Marital Status: Married    Spouse Name: N/A  . Number of Children: N/A  . Years of Education: N/A   Occupational History  . Not on file.   Social History Main Topics  . Smoking status: Current Every Day Smoker -- 1.00 packs/day    Types: Cigarettes  . Smokeless tobacco: Never Used  . Alcohol Use: No  . Drug Use: No  . Sexual Activity: Not on file   Other Topics Concern  . Not on file   Social History Narrative   Single. In a relationship.   Works for a Engineer, technical sales.   Highest level of education High School.   Enjoys golfing, walking, collecting [place] artifacts.       Past Surgical History  Procedure Laterality Date  . Spine surgery    . Fracture surgery    . Appendectomy  1976    No family history on file.  No Known Allergies  Current Outpatient Prescriptions on File Prior to Visit  Medication Sig Dispense Refill  . lisinopril-hydrochlorothiazide (PRINZIDE,ZESTORETIC) 20-25 MG per tablet Take 1 tablet by mouth daily. 30 tablet 3     No current facility-administered medications on file prior to visit.    BP 152/90 mmHg  Pulse 59  Temp(Src) 97.9 F (36.6 C) (Oral)  Ht 5\' 8"  (1.727 m)  Wt 131 lb 6.4 oz (59.603 kg)  BMI 19.98 kg/m2  SpO2 97%    Objective:   Physical Exam  Constitutional: He appears well-nourished.  Cardiovascular: Normal rate and regular rhythm.   Pulmonary/Chest: Effort normal and breath sounds normal.  Skin: Skin is warm and dry.          Assessment & Plan:

## 2015-05-16 NOTE — Assessment & Plan Note (Signed)
Increased since last visit despite taking daily medication. Denies chest pain, SOB. Cough present, not bothersome per patient. He's not checking his BP at home. Will have him check BP for 2 weeks and send them to me. If they are elevated, then will add another medication. BMP pending today.

## 2015-05-19 ENCOUNTER — Encounter: Payer: Self-pay | Admitting: *Deleted

## 2015-05-30 ENCOUNTER — Telehealth: Payer: Self-pay | Admitting: Primary Care

## 2015-05-30 NOTE — Telephone Encounter (Signed)
Will you please check on Mr. Brigante BP readings? He was supposed to measure over the past 2 weeks. Thanks.

## 2015-05-30 NOTE — Telephone Encounter (Signed)
Spoken to patient's spouse and patient have not checked his BP. Will check it and let me know.

## 2015-06-02 ENCOUNTER — Telehealth: Payer: Self-pay | Admitting: Primary Care

## 2015-06-02 NOTE — Telephone Encounter (Addendum)
Spoken to patient. Have not checked BP and will be checking it. Patient will let me know.

## 2015-06-02 NOTE — Telephone Encounter (Signed)
Noted. Will call again in 2 weeks.

## 2015-06-16 ENCOUNTER — Telehealth: Payer: Self-pay | Admitting: Primary Care

## 2015-06-16 NOTE — Telephone Encounter (Signed)
Message left for patient to return my call.  

## 2015-06-16 NOTE — Telephone Encounter (Signed)
How are Jeffery Taylor blood pressure readings?

## 2015-06-18 ENCOUNTER — Telehealth: Payer: Self-pay | Admitting: Primary Care

## 2015-06-18 DIAGNOSIS — I10 Essential (primary) hypertension: Secondary | ICD-10-CM

## 2015-06-18 MED ORDER — AMLODIPINE BESYLATE 10 MG PO TABS: 10.0000 mg | ORAL_TABLET | Freq: Every day | ORAL | Status: DC

## 2015-06-18 NOTE — Telephone Encounter (Signed)
Blood pressure is still not controlled. Will add Amlodipine 10 mg tablets to his regimen. He will take 1 tablet by mouth daily. Please have him come in for an office visit in 2 weeks. Thanks.

## 2015-06-18 NOTE — Telephone Encounter (Signed)
Called spouse and asked about patient's BP. She stated the last time they checked was last week, it was 164/90.

## 2015-06-23 NOTE — Telephone Encounter (Signed)
Notified patient of Kate's comments. Patient verbalized understanding. Last BP was 140/82 on 06/19/2015. Will call to schedule appointment.

## 2015-09-12 ENCOUNTER — Other Ambulatory Visit: Payer: Self-pay | Admitting: Primary Care

## 2015-09-12 DIAGNOSIS — I10 Essential (primary) hypertension: Secondary | ICD-10-CM

## 2015-09-12 NOTE — Telephone Encounter (Signed)
Electronically refill request for   lisinopril-hydrochlorothiazide (PRINZIDE,ZESTORETIC) 20-25 MG tablet   Take 1 tablet by mouth daily.  Dispense: 30 tablet   Refills: 3     Last prescribed on 05/16/2015. Last seen on 05/16/2015. No future appointment.

## 2015-09-19 ENCOUNTER — Ambulatory Visit (INDEPENDENT_AMBULATORY_CARE_PROVIDER_SITE_OTHER): Payer: Medicare Other | Admitting: Primary Care

## 2015-09-19 VITALS — BP 138/86 | HR 60 | Temp 98.1°F | Ht 68.0 in | Wt 131.8 lb

## 2015-09-19 DIAGNOSIS — I10 Essential (primary) hypertension: Secondary | ICD-10-CM | POA: Diagnosis not present

## 2015-09-19 DIAGNOSIS — M720 Palmar fascial fibromatosis [Dupuytren]: Secondary | ICD-10-CM

## 2015-09-19 DIAGNOSIS — M79602 Pain in left arm: Secondary | ICD-10-CM | POA: Diagnosis not present

## 2015-09-19 NOTE — Progress Notes (Signed)
Subjective:    Patient ID: Jeffery Taylor, male    DOB: June 11, 1951, 65 y.o.   MRN: [id number]  HPI  Jeffery Taylor a 65 year old male who presents today for follow up of hypertension and a chief complaint of hand pain.  1) Essential Hypertension: Currently managed on lisinopril-HCTZ 20/25mg  and amlodipine 10 mg. BP stable in the office today. Denies chest pain, SOB, dizziness. He endorses compliance to his BP medications.   2) Hand Pain: Located to the left upper extremity. He has a metal plate and three screws to his left anterior forearm that was placed in 1985. His pain is present to his left upper forearm down to his left wrist. He's also has a history of trigger finger to his left 3rd digit that has been present for the past 6 years. His trigger finger symptoms have become worse over the past several months.   He's had cortisone injections to his finger in the past with temporary improvement. His forearm pain is constant, worse at the end of the day. He's taken OTC arthritis tablets and applied heating pads without significant improvement. He did have a carpel tunel release surgery 12 years ago.   Review of Systems  Respiratory: Negative for cough and shortness of breath.   Cardiovascular: Negative for chest pain.  Musculoskeletal: Positive for arthralgias.  Neurological: Negative for dizziness and headaches.       Past Medical History  Diagnosis Date  . Stroke ([place])   . Hypertension   . Kidney stones     Social History   Social History  . Marital Status: Married    Spouse Name: N/A  . Number of Children: N/A  . Years of Education: N/A   Occupational History  . Not on file.   Social History Main Topics  . Smoking status: Current Every Day Smoker -- 1.00 packs/day    Types: Cigarettes  . Smokeless tobacco: Never Used  . Alcohol Use: No  . Drug Use: No  . Sexual Activity: Not on file   Other Topics Concern  . Not on file   Social History Narrative   Single. In a  relationship.   Works for a Engineer, technical sales.   Highest level of education High School.   Enjoys golfing, walking, collecting [place] artifacts.       Past Surgical History  Procedure Laterality Date  . Spine surgery    . Fracture surgery    . Appendectomy  1976    No family history on file.  No Known Allergies  Current Outpatient Prescriptions on File Prior to Visit  Medication Sig Dispense Refill  . amLODipine (NORVASC) 10 MG tablet Take 1 tablet (10 mg total) by mouth daily. 90 tablet 3  . lisinopril-hydrochlorothiazide (PRINZIDE,ZESTORETIC) 20-25 MG tablet TAKE ONE TABLET BY MOUTH ONCE DAILY 30 tablet 0   No current facility-administered medications on file prior to visit.    BP 138/86 mmHg  Pulse 60  Temp(Src) 98.1 F (36.7 C) (Oral)  Ht 5\' 8"  (1.727 m)  Wt 131 lb 12.8 oz (59.784 kg)  BMI 20.04 kg/m2  SpO2 98%    Objective:   Physical Exam  Constitutional: He appears well-nourished.  Cardiovascular: Normal rate and regular rhythm.   Pulmonary/Chest: Effort normal and breath sounds normal.  Musculoskeletal:  Decrease in ROM to the left 3rd digit. Left forearm with tenderness and decrease in ROM. Good pulses. No radiculopathy.  Skin: Skin is warm and dry.  Assessment & Plan:

## 2015-09-19 NOTE — Assessment & Plan Note (Addendum)
Continues to left 3rd digit, worse recently. Also with left forearm pain to location of metal plate. Decrease in ROM to both digit and forearm. Referral placed for ortho evaluation. Continue NSaids and ice prn.

## 2015-09-19 NOTE — Progress Notes (Signed)
Pre visit review using our clinic review tool, if applicable. No additional management support is needed unless otherwise documented below in the visit note. 

## 2015-09-19 NOTE — Patient Instructions (Signed)
Stop by the front desk and speak with either Rosaria Ferries or Ebony Hail regarding your referral to orthopedics for your forearm and hand pain.  Continue your blood pressure medication as directed.  Follow up in 4 to 6 months for your annual physical exam.  Please call me if you need anything.  It was a pleasure to see you today!

## 2015-09-19 NOTE — Assessment & Plan Note (Signed)
Stable today. Continue current regimen. 

## 2015-09-24 DIAGNOSIS — M1812 Unilateral primary osteoarthritis of first carpometacarpal joint, left hand: Secondary | ICD-10-CM | POA: Diagnosis not present

## 2015-09-24 DIAGNOSIS — G5603 Carpal tunnel syndrome, bilateral upper limbs: Secondary | ICD-10-CM | POA: Diagnosis not present

## 2015-09-24 DIAGNOSIS — M65332 Trigger finger, left middle finger: Secondary | ICD-10-CM | POA: Diagnosis not present

## 2015-09-24 DIAGNOSIS — M47812 Spondylosis without myelopathy or radiculopathy, cervical region: Secondary | ICD-10-CM | POA: Diagnosis not present

## 2015-10-14 DIAGNOSIS — G5603 Carpal tunnel syndrome, bilateral upper limbs: Secondary | ICD-10-CM | POA: Diagnosis not present

## 2015-10-17 DIAGNOSIS — M65332 Trigger finger, left middle finger: Secondary | ICD-10-CM | POA: Diagnosis not present

## 2015-10-17 DIAGNOSIS — G5601 Carpal tunnel syndrome, right upper limb: Secondary | ICD-10-CM | POA: Diagnosis not present

## 2015-10-20 ENCOUNTER — Other Ambulatory Visit: Payer: Self-pay | Admitting: Primary Care

## 2016-02-23 DIAGNOSIS — M65332 Trigger finger, left middle finger: Secondary | ICD-10-CM | POA: Diagnosis not present

## 2016-02-24 DIAGNOSIS — M65332 Trigger finger, left middle finger: Secondary | ICD-10-CM | POA: Diagnosis not present

## 2016-03-11 DIAGNOSIS — M65332 Trigger finger, left middle finger: Secondary | ICD-10-CM | POA: Diagnosis not present

## 2016-03-16 ENCOUNTER — Other Ambulatory Visit: Payer: Self-pay | Admitting: Primary Care

## 2016-03-16 MED ORDER — LISINOPRIL-HYDROCHLOROTHIAZIDE 20-25 MG PO TABS: 1.0000 | ORAL_TABLET | Freq: Every day | ORAL | 5 refills | Status: DC

## 2016-03-16 NOTE — Telephone Encounter (Signed)
Sent refill as requested.

## 2016-03-16 NOTE — Telephone Encounter (Signed)
Received faxed refill request for  lisinopril-hydrochlorothiazide (PRINZIDE,ZESTORETIC) 20-25 MG tablet  Last prescribed on 10/20/2015. Last seen 09/19/2015.

## 2016-05-27 ENCOUNTER — Telehealth: Payer: Self-pay | Admitting: Primary Care

## 2016-05-27 NOTE — Telephone Encounter (Signed)
LVM for pt to call back and schedule AWV + labs with Lesia and CPE with PCP. °

## 2016-06-25 ENCOUNTER — Other Ambulatory Visit: Payer: Self-pay | Admitting: Primary Care

## 2016-06-25 DIAGNOSIS — I10 Essential (primary) hypertension: Secondary | ICD-10-CM

## 2016-06-25 MED ORDER — AMLODIPINE BESYLATE 10 MG PO TABS
10.0000 mg | ORAL_TABLET | Freq: Every day | ORAL | 0 refills | Status: DC
Start: 2016-06-25 — End: 2016-06-28

## 2016-06-25 NOTE — Telephone Encounter (Signed)
Patient's spouse request to have refill of amlodipine 10 mg.  Last prescribed on 06/21/2015. Last seen on 09/19/2015.

## 2016-06-28 ENCOUNTER — Other Ambulatory Visit: Payer: Self-pay | Admitting: Primary Care

## 2016-06-28 DIAGNOSIS — I10 Essential (primary) hypertension: Secondary | ICD-10-CM

## 2016-07-22 ENCOUNTER — Telehealth: Payer: Self-pay

## 2016-07-22 NOTE — Telephone Encounter (Signed)
Left message to call our office to make a AWV.

## 2016-09-21 ENCOUNTER — Telehealth: Payer: Self-pay | Admitting: Primary Care

## 2016-09-21 NOTE — Telephone Encounter (Signed)
LVM for pt to call back and schedule Welcome to [facility] exam with PCP. °

## 2016-09-28 ENCOUNTER — Encounter: Payer: Self-pay | Admitting: Primary Care

## 2016-09-28 ENCOUNTER — Ambulatory Visit (INDEPENDENT_AMBULATORY_CARE_PROVIDER_SITE_OTHER): Payer: Medicare Other | Admitting: Primary Care

## 2016-09-28 ENCOUNTER — Other Ambulatory Visit: Payer: Self-pay | Admitting: Primary Care

## 2016-09-28 ENCOUNTER — Encounter (INDEPENDENT_AMBULATORY_CARE_PROVIDER_SITE_OTHER): Payer: Self-pay

## 2016-09-28 VITALS — BP 122/76 | HR 68 | Temp 97.9°F | Ht 68.0 in | Wt 130.0 lb

## 2016-09-28 DIAGNOSIS — E785 Hyperlipidemia, unspecified: Secondary | ICD-10-CM | POA: Diagnosis not present

## 2016-09-28 DIAGNOSIS — I10 Essential (primary) hypertension: Secondary | ICD-10-CM | POA: Diagnosis not present

## 2016-09-28 DIAGNOSIS — F17200 Nicotine dependence, unspecified, uncomplicated: Secondary | ICD-10-CM

## 2016-09-28 DIAGNOSIS — M720 Palmar fascial fibromatosis [Dupuytren]: Secondary | ICD-10-CM | POA: Diagnosis not present

## 2016-09-28 DIAGNOSIS — Z122 Encounter for screening for malignant neoplasm of respiratory organs: Secondary | ICD-10-CM

## 2016-09-28 DIAGNOSIS — Z8673 Personal history of transient ischemic attack (TIA), and cerebral infarction without residual deficits: Secondary | ICD-10-CM

## 2016-09-28 MED ORDER — AMLODIPINE BESYLATE 10 MG PO TABS: 10.0000 mg | ORAL_TABLET | Freq: Every day | ORAL | 3 refills | Status: DC

## 2016-09-28 MED ORDER — LISINOPRIL-HYDROCHLOROTHIAZIDE 20-25 MG PO TABS: 1.0000 | ORAL_TABLET | Freq: Every day | ORAL | 3 refills | Status: DC

## 2016-09-28 NOTE — Assessment & Plan Note (Signed)
Is working on cutting back. No alarm signs. Referral placed for lung cancer screening.

## 2016-09-28 NOTE — Assessment & Plan Note (Addendum)
Check lipids today. Consider statin given history of CVA, hypertension, and tobacco abuse. Discussed to start daily aspirin.

## 2016-09-28 NOTE — Progress Notes (Signed)
Pre visit review using our clinic review tool, if applicable. No additional management support is needed unless otherwise documented below in the visit note. 

## 2016-09-28 NOTE — Progress Notes (Signed)
   Subjective:    Patient ID: Jeffery Taylor, male    DOB: January 01, 1951, 66 y.o.   MRN: [id number]  HPI  Jeffery Taylor is a 66 year old male who presents today for follow up.  1) Essential Hypertension: Currently managed on amlodipine 10 mg and lisinopril-HCTZ 20-25 mg. His BP in the office today is 122/76. Prior history of CVA 20+ years ago. He denies chest pain, dizziness, shortness of breath. He is needing refills today.  2) Hyperlipidemia: Lipid panel slightly above goal in April 2016. Current smoker, prior CVA 20+ years ago. He is not managed on treatment.   3) Tobacco Abuse: Long history of tobacco abuse for 20+ years. Smoking 1/2 PPD of cigarettes daily. He is cutting back on his cigarettes daily. He denies unexplained weight loss, hemoptysis. He feels as though he smokes more now that he's not working, he is aware of this.   Review of Systems  Constitutional: Negative for unexpected weight change.  Eyes: Negative for visual disturbance.  Respiratory: Negative for cough, shortness of breath and wheezing.   Cardiovascular: Negative for chest pain.  Neurological: Negative for dizziness and weakness.       Past Medical History:  Diagnosis Date  . Hypertension   . Kidney stones   . Stroke [facility])      Social History   Social History  . Marital status: Married    Spouse name: N/A  . Number of children: N/A  . Years of education: N/A   Occupational History  . Not on file.   Social History Main Topics  . Smoking status: Current Every Day Smoker    Packs/day: 1.00    Types: Cigarettes  . Smokeless tobacco: Never Used  . Alcohol use No  . Drug use: No  . Sexual activity: Not on file   Other Topics Concern  . Not on file   Social History Narrative   Single. In a relationship.   Works for a [employer], technical sales.   Highest level of education High School.   Enjoys golfing, walking, collecting [place] artifacts.       Past Surgical History:  Procedure Laterality Date  .  APPENDECTOMY  1976  . FRACTURE SURGERY    . SPINE SURGERY      No family history on file.  No Known Allergies  No current outpatient prescriptions on file prior to visit.   No current facility-administered medications on file prior to visit.     BP 122/76   Pulse 68   Temp 97.9 F (36.6 C) (Oral)   Ht 5\' 8"  (1.727 m)   Wt 130 lb (59 kg)   SpO2 98%   BMI 19.77 kg/m    Objective:   Physical Exam  Constitutional: He appears well-nourished.  Neck: Neck supple.  Cardiovascular: Normal rate and regular rhythm.   Pulmonary/Chest: Effort normal and breath sounds normal. He has no wheezes.  Skin: Skin is warm and dry.          Assessment & Plan:

## 2016-09-28 NOTE — Assessment & Plan Note (Signed)
Stable in the clinic today, continue current regimen. BMP pending, refills provided.

## 2016-09-28 NOTE — Assessment & Plan Note (Signed)
Noted to be slightly elevated on labs from 2016. Recheck today. Consider statin.

## 2016-09-28 NOTE — Assessment & Plan Note (Signed)
Doing well since surgery. 

## 2016-09-28 NOTE — Patient Instructions (Signed)
I sent refills of your medication to the pharmacy.  You will be contacted regarding your referral for lung cancer screening.  Please let us know if you have not heard back within one week.   Schedule a lab only appointment in the near future. Ensure you are fasting prior to labs.  Follow up in 1 year for re-evaluation.  It was a pleasure to see you today!

## 2016-09-29 ENCOUNTER — Telehealth: Payer: Self-pay | Admitting: *Deleted

## 2016-09-29 DIAGNOSIS — Z87891 Personal history of nicotine dependence: Secondary | ICD-10-CM

## 2016-09-29 NOTE — Telephone Encounter (Signed)
Received referral for initial lung cancer screening scan. Contacted patient and obtained smoking history,(current, 34.5 pack year) as well as answering questions related to screening process. Patient denies signs of lung cancer such as weight loss or hemoptysis. Patient denies comorbidity that would prevent curative treatment if lung cancer were found. Patient is tentatively scheduled for shared decision making visit and CT scan on 10/11/16, pending insurance approval from business office.

## 2016-09-29 NOTE — Telephone Encounter (Signed)
Received referral for low dose lung cancer screening CT scan. Voicemail left at phone number listed in EMR for patient to call me back to facilitate scheduling scan.  

## 2016-10-04 ENCOUNTER — Other Ambulatory Visit (INDEPENDENT_AMBULATORY_CARE_PROVIDER_SITE_OTHER): Payer: Medicare Other

## 2016-10-04 DIAGNOSIS — E785 Hyperlipidemia, unspecified: Secondary | ICD-10-CM | POA: Diagnosis not present

## 2016-10-04 DIAGNOSIS — I10 Essential (primary) hypertension: Secondary | ICD-10-CM | POA: Diagnosis not present

## 2016-10-04 LAB — LIPID PANEL
Cholesterol: 214 mg/dL — ABNORMAL HIGH (ref 0–200)
HDL: 39.3 mg/dL (ref >39.00)
LDL Cholesterol: 143 mg/dL — ABNORMAL HIGH (ref 0–99)
NonHDL: 174.44
Total CHOL/HDL Ratio: 5
Triglycerides: 156 mg/dL — ABNORMAL HIGH (ref 0.0–149.0)
VLDL: 31.2 mg/dL (ref 0.0–40.0)

## 2016-10-04 LAB — COMPREHENSIVE METABOLIC PANEL
ALT: 12 U/L (ref 0–53)
AST: 16 U/L (ref 0–37)
Albumin: 4.4 g/dL (ref 3.5–5.2)
Alkaline Phosphatase: 62 U/L (ref 39–117)
BUN: 22 mg/dL (ref 6–23)
CO2: 29 mEq/L (ref 19–32)
Calcium: 11.4 mg/dL — ABNORMAL HIGH (ref 8.4–10.5)
Chloride: 105 mEq/L (ref 96–112)
Creatinine, Ser: 1.24 mg/dL (ref 0.40–1.50)
GFR: 61.81 mL/min (ref >60.00)
Glucose, Bld: 96 mg/dL (ref 70–99)
Potassium: 4.4 mEq/L (ref 3.5–5.1)
Sodium: 137 mEq/L (ref 135–145)
Total Bilirubin: 0.8 mg/dL (ref 0.2–1.2)
Total Protein: 7.5 g/dL (ref 6.0–8.3)

## 2016-10-05 ENCOUNTER — Encounter: Payer: Self-pay | Admitting: *Deleted

## 2016-10-07 ENCOUNTER — Other Ambulatory Visit: Payer: Self-pay | Admitting: Primary Care

## 2016-10-07 DIAGNOSIS — E785 Hyperlipidemia, unspecified: Secondary | ICD-10-CM

## 2016-10-07 MED ORDER — ATORVASTATIN CALCIUM 10 MG PO TABS: 10.0000 mg | ORAL_TABLET | Freq: Every day | ORAL | 3 refills | Status: DC

## 2016-10-11 ENCOUNTER — Ambulatory Visit
Admission: RE | Admit: 2016-10-11 | Discharge: 2016-10-11 | Disposition: A | Discharge status: Home / Self Care | Payer: Medicare Other | Source: Ambulatory Visit | Attending: Oncology | Admitting: Oncology

## 2016-10-11 ENCOUNTER — Encounter: Payer: Self-pay | Admitting: Oncology

## 2016-10-11 ENCOUNTER — Inpatient Hospital Stay: Payer: Medicare Other | Attending: Oncology | Admitting: Oncology

## 2016-10-11 DIAGNOSIS — I700 Atherosclerosis of aorta: Secondary | ICD-10-CM | POA: Insufficient documentation

## 2016-10-11 DIAGNOSIS — R93421 Abnormal radiologic findings on diagnostic imaging of right kidney: Secondary | ICD-10-CM | POA: Diagnosis not present

## 2016-10-11 DIAGNOSIS — Z87891 Personal history of nicotine dependence: Secondary | ICD-10-CM | POA: Diagnosis not present

## 2016-10-11 DIAGNOSIS — Z122 Encounter for screening for malignant neoplasm of respiratory organs: Secondary | ICD-10-CM | POA: Diagnosis not present

## 2016-10-11 DIAGNOSIS — J439 Emphysema, unspecified: Secondary | ICD-10-CM | POA: Diagnosis not present

## 2016-10-11 DIAGNOSIS — F17210 Nicotine dependence, cigarettes, uncomplicated: Secondary | ICD-10-CM | POA: Diagnosis not present

## 2016-10-11 DIAGNOSIS — I2510 Atherosclerotic heart disease of native coronary artery without angina pectoris: Secondary | ICD-10-CM | POA: Insufficient documentation

## 2016-10-11 NOTE — Progress Notes (Signed)
Personal history of tobacco use, presenting hazards to health In accordance with CMS guidelines, patient has met eligibility criteria including age, absence of signs or symptoms of lung cancer.  Social History  Substance Use Topics  . Smoking status: Current Every Day Smoker    Packs/day: 0.75    Years: 46.00    Types: Cigarettes  . Smokeless tobacco: Never Used  . Alcohol use No     A shared decision-making session was conducted prior to the performance of CT scan. This includes one or more decision aids, includes benefits and harms of screening, follow-up diagnostic testing, over-diagnosis, false positive rate, and total radiation exposure.  Counseling on the importance of adherence to annual lung cancer LDCT screening, impact of co-morbidities, and ability or willingness to undergo diagnosis and treatment is imperative for compliance of the program.  Counseling on the importance of continued smoking cessation for former smokers; the importance of smoking cessation for current smokers, and information about tobacco cessation interventions have been given to patient including [place] and [facility] quit [place] programs.  Written order for lung cancer screening with LDCT has been given to the patient and any and all questions have been answered to the best of my abilities.   Yearly follow up will be coordinated by Burgess Estelle, Thoracic Navigator.   Lucendia Herrlich, NP  10/11/16 2:55 PM

## 2016-10-11 NOTE — Assessment & Plan Note (Signed)
In accordance with CMS guidelines, patient has met eligibility criteria including age, absence of signs or symptoms of lung cancer.  Social History  Substance Use Topics  . Smoking status: Current Every Day Smoker    Packs/day: 0.75    Years: 46.00    Types: Cigarettes  . Smokeless tobacco: Never Used  . Alcohol use No     A shared decision-making session was conducted prior to the performance of CT scan. This includes one or more decision aids, includes benefits and harms of screening, follow-up diagnostic testing, over-diagnosis, false positive rate, and total radiation exposure.  Counseling on the importance of adherence to annual lung cancer LDCT screening, impact of co-morbidities, and ability or willingness to undergo diagnosis and treatment is imperative for compliance of the program.  Counseling on the importance of continued smoking cessation for former smokers; the importance of smoking cessation for current smokers, and information about tobacco cessation interventions have been given to patient including [place] and [facility] quit [place] programs.  Written order for lung cancer screening with LDCT has been given to the patient and any and all questions have been answered to the best of my abilities.   Yearly follow up will be coordinated by Burgess Estelle, Thoracic Navigator.

## 2016-10-13 ENCOUNTER — Telehealth: Payer: Self-pay | Admitting: *Deleted

## 2016-10-13 NOTE — Telephone Encounter (Signed)
Voicemail left in attempt to notify patient of LDCT lung cancer screening results with recommendation for 12 month follow up imaging. Also, upon return call, patient will be notified of incidental finding noted below. PCP will also be notified by forward of this note via Epic.   IMPRESSION: 1. Lung-RADS Category 2, benign appearance or behavior. Continue annual screening with low-dose chest CT without contrast in 12 months 2. Aortic atherosclerosis and coronary artery calcifications 3. Diffuse bronchial wall thickening with emphysema, as above; imaging findings suggestive of underlying COPD. 4. Indeterminate hyperdense structure arises from the upper pole of right kidney. Consider further evaluation with nonemergent renal protocol MRI.

## 2016-10-13 NOTE — Telephone Encounter (Signed)
Please notify patient that his CT scan found a mass on his right kidney. We recommend an MRI to get a better look. Please notify me if he's agreeable and I'll set up an MRI.

## 2016-10-13 NOTE — Telephone Encounter (Signed)
Spoken and notified patient of Kate's comments. Patient stated that he wants to think about it. He will call back later to let us know.

## 2016-10-16 NOTE — Telephone Encounter (Signed)
Declined

## 2016-11-15 ENCOUNTER — Other Ambulatory Visit: Payer: Self-pay | Admitting: Primary Care

## 2016-11-15 NOTE — Telephone Encounter (Signed)
Duplicate request. Refills are available at pharmacy.

## 2017-05-24 DIAGNOSIS — M5412 Radiculopathy, cervical region: Secondary | ICD-10-CM | POA: Diagnosis not present

## 2017-05-24 DIAGNOSIS — G5603 Carpal tunnel syndrome, bilateral upper limbs: Secondary | ICD-10-CM | POA: Diagnosis not present

## 2017-05-24 DIAGNOSIS — M47812 Spondylosis without myelopathy or radiculopathy, cervical region: Secondary | ICD-10-CM | POA: Diagnosis not present

## 2017-07-28 IMAGING — CT CT CHEST LUNG CANCER SCREENING LOW DOSE W/O CM
1 of 2 series · 15 of 40 positions shown, 19 images · non-contrast
Comparison: None.

CLINICAL DATA: Lung cancer screening. 34.5 pack-year history.
Current asymptomatic smoker.

EXAM:
CT CHEST WITHOUT CONTRAST LOW-DOSE FOR LUNG CANCER SCREENING
TECHNIQUE: Multidetector CT imaging of the chest was performed following the
standard protocol without IV contrast.

[Series 2: axial st · axial · 0.66mm/px · z∈[-656,-371]mm · 15 of 63 slices shown, 19 images]
[im 3/63  mediastinal]
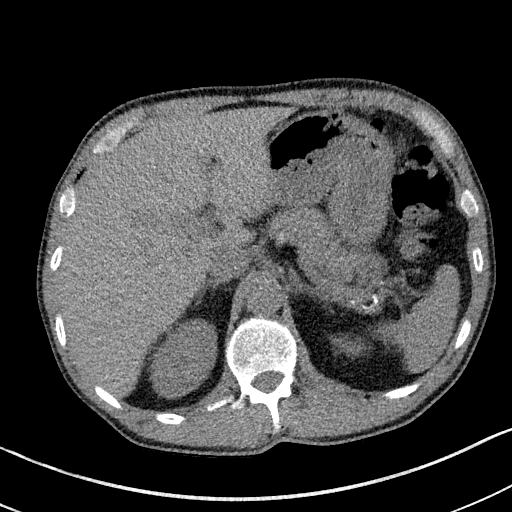
[im 3/63  lung]
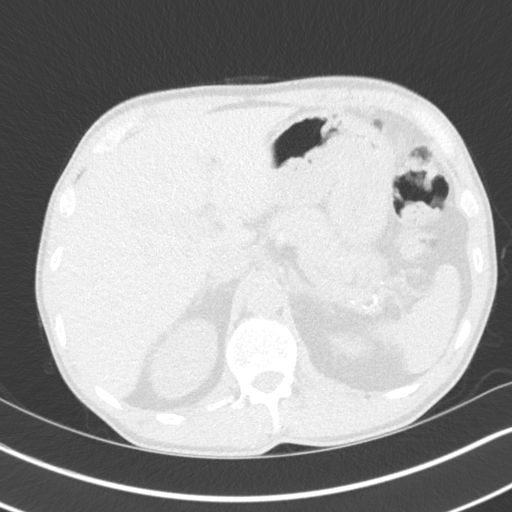
[im 8/63  lung]
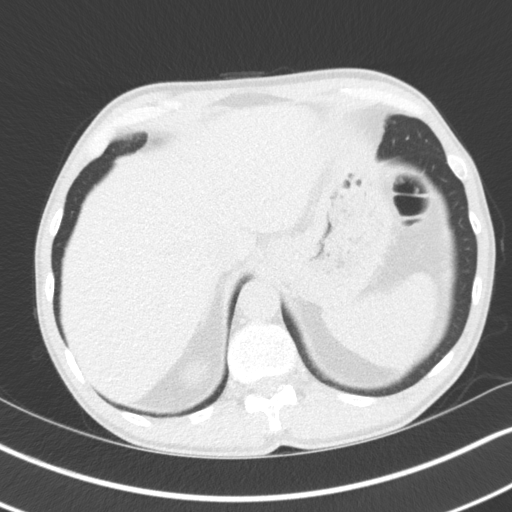
[im 12/63  lung]
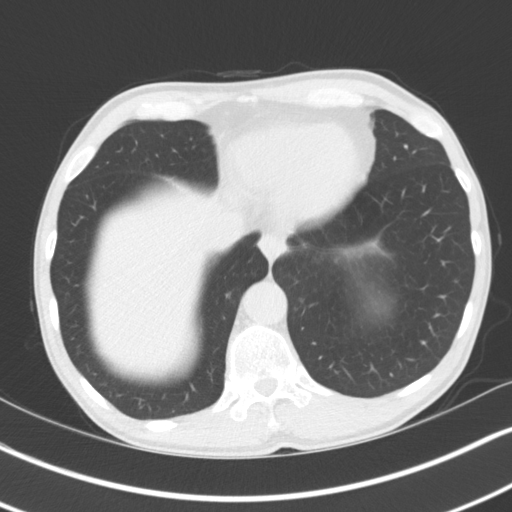
[im 16/63  lung]
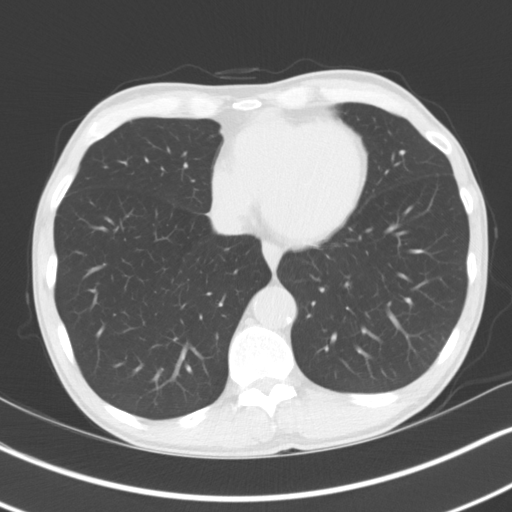
[im 20/63  mediastinal]
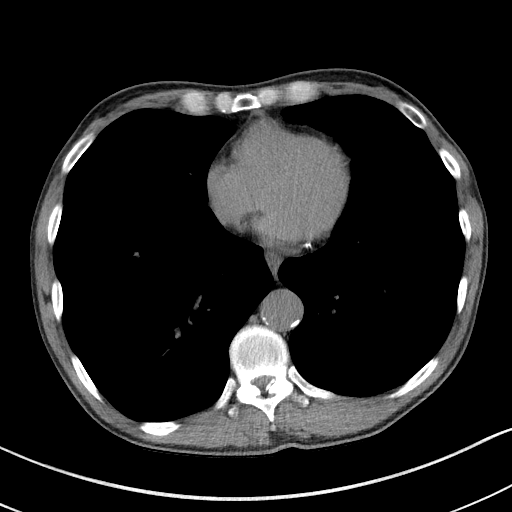
[im 20/63  lung]
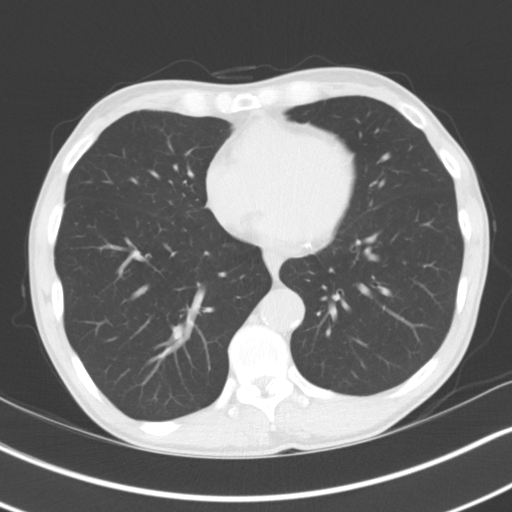
[im 24/63  lung]
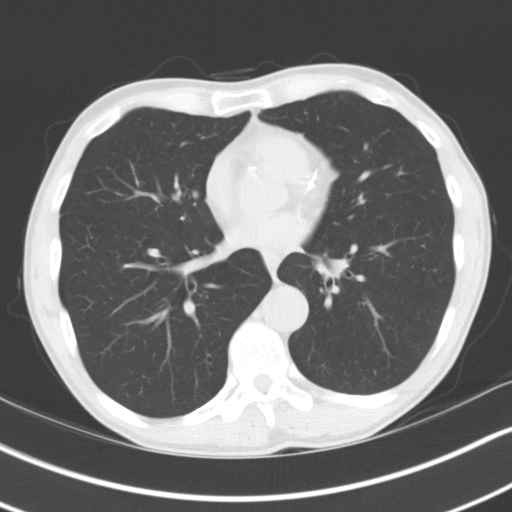
[im 29/63  lung]
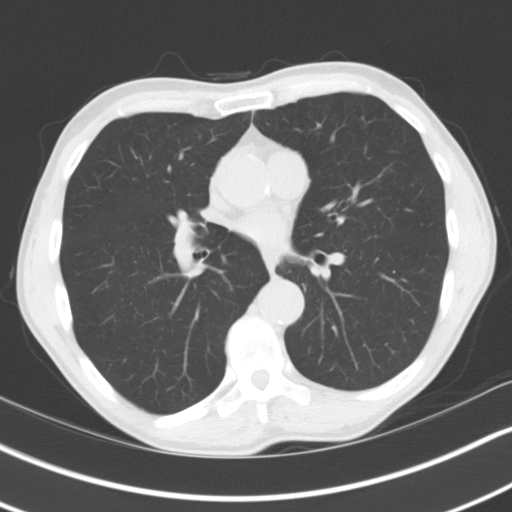
[im 32/63  lung]
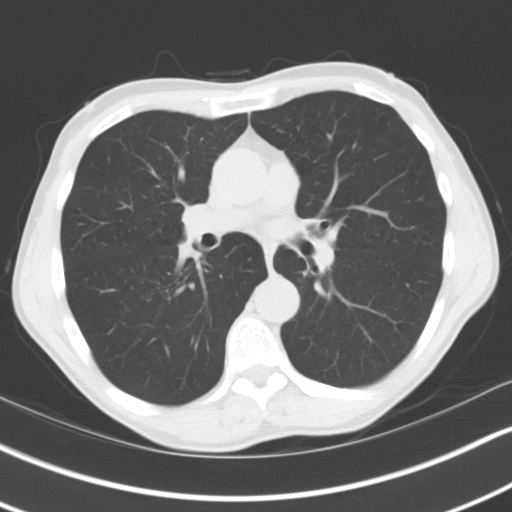
[im 34/63  mediastinal]
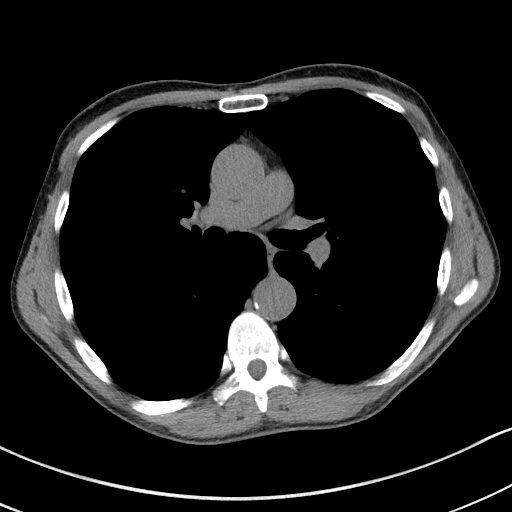
[im 34/63  lung]
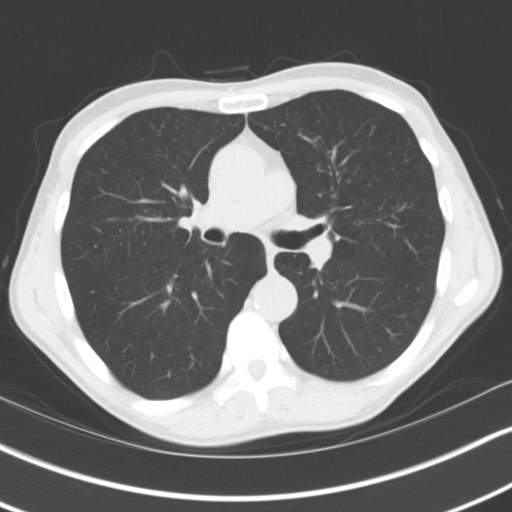
[im 39/63  lung]
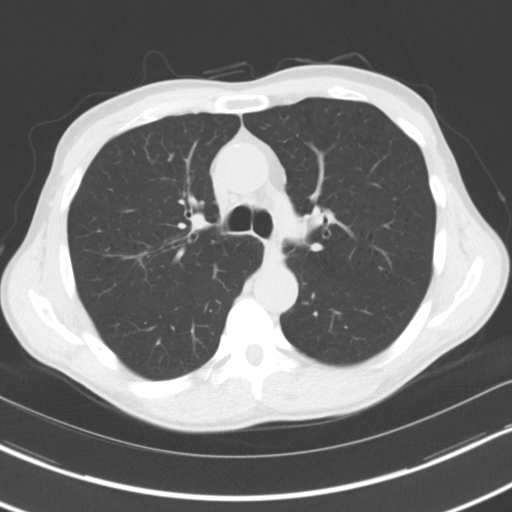
[im 43/63  lung]
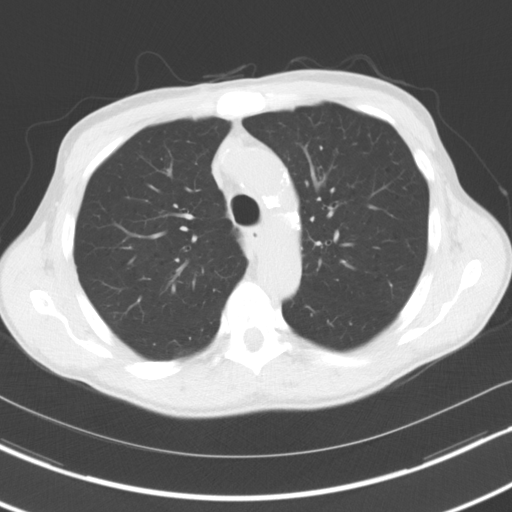
[im 47/63  lung]
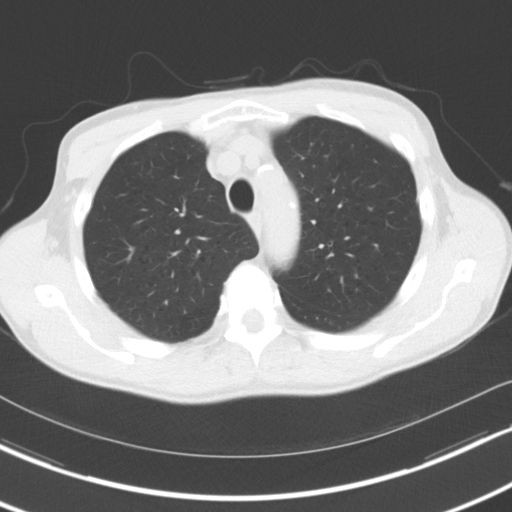
[im 51/63  mediastinal]
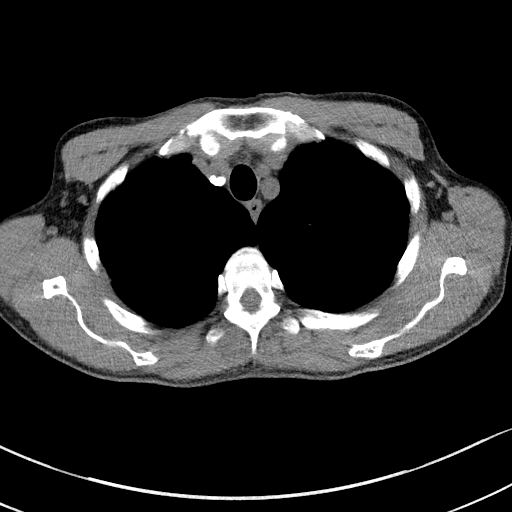
[im 51/63  lung]
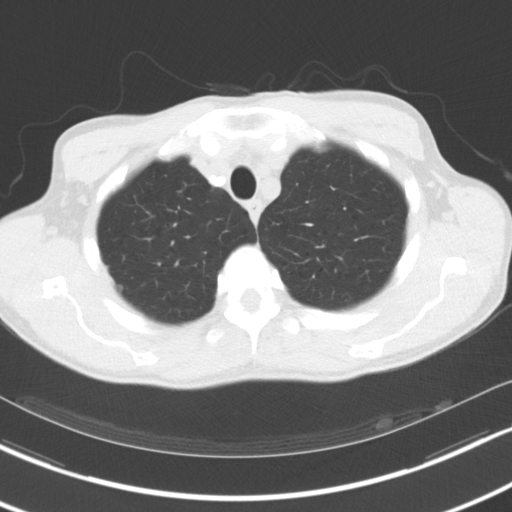
[im 55/63  lung]
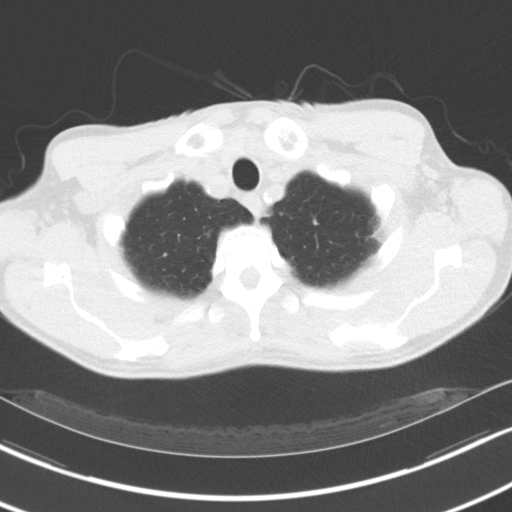
[im 60/63  lung]
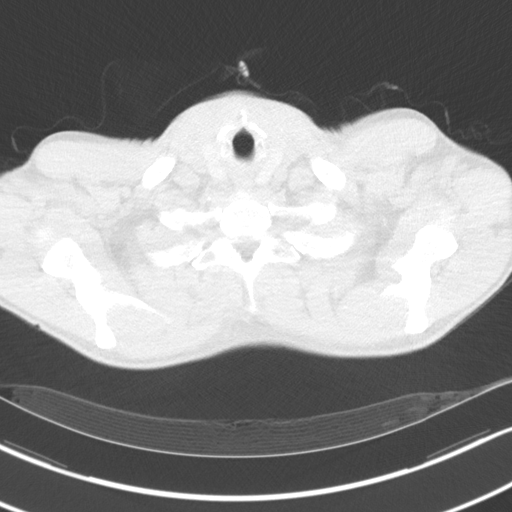

[15 of 40 positions shown; findings below may reference images not displayed]

FINDINGS: Cardiovascular: Heart size is normal. Aortic atherosclerosis.
Calcification in the RCA, LAD and left circumflex coronary artery
noted. No pericardial effusion.

Mediastinum/Nodes: No enlarged mediastinal, hilar, or axillary lymph
nodes. Thyroid gland, trachea, and esophagus demonstrate no
significant findings.

Lungs/Pleura: Moderate change of centrilobular emphysema. Bronchial
wall thickening noted. Subpleural nodule within the posterior right
lower lobe has an equivalent diameter of 3.1 mm.

Upper Abdomen: Indeterminate hyperdense lesion arising from the
upper pole of the right kidney measures 1.3 cm. No acute abnormality
identified.

Musculoskeletal: No aggressive lytic or sclerotic bone lesions.
IMPRESSION: 1. Lung-RADS Category 2, benign appearance or behavior. Continue
annual screening with low-dose chest CT without contrast in 12
months
2. Aortic atherosclerosis and coronary artery calcifications
3. Diffuse bronchial wall thickening with emphysema, as above;
imaging findings suggestive of underlying COPD.
4. Indeterminate hyperdense structure arises from the upper pole of
right kidney. Consider further evaluation with nonemergent renal
protocol MRI.

## 2017-09-16 ENCOUNTER — Other Ambulatory Visit: Payer: Self-pay | Admitting: Primary Care

## 2017-09-16 DIAGNOSIS — I10 Essential (primary) hypertension: Secondary | ICD-10-CM

## 2017-09-16 MED ORDER — AMLODIPINE BESYLATE 10 MG PO TABS: 10.0000 mg | ORAL_TABLET | Freq: Every day | ORAL | 0 refills | Status: DC

## 2017-10-03 ENCOUNTER — Encounter: Payer: Self-pay | Admitting: Gastroenterology

## 2017-10-03 ENCOUNTER — Encounter: Payer: Self-pay | Admitting: Primary Care

## 2017-10-03 ENCOUNTER — Ambulatory Visit (INDEPENDENT_AMBULATORY_CARE_PROVIDER_SITE_OTHER): Payer: Medicare Other | Admitting: Primary Care

## 2017-10-03 VITALS — BP 140/72 | HR 68 | Temp 98.2°F | Ht 68.0 in | Wt 132.5 lb

## 2017-10-03 DIAGNOSIS — Z1211 Encounter for screening for malignant neoplasm of colon: Secondary | ICD-10-CM

## 2017-10-03 DIAGNOSIS — Z87891 Personal history of nicotine dependence: Secondary | ICD-10-CM | POA: Diagnosis not present

## 2017-10-03 DIAGNOSIS — M720 Palmar fascial fibromatosis [Dupuytren]: Secondary | ICD-10-CM

## 2017-10-03 DIAGNOSIS — I10 Essential (primary) hypertension: Secondary | ICD-10-CM | POA: Diagnosis not present

## 2017-10-03 DIAGNOSIS — Z0000 Encounter for general adult medical examination without abnormal findings: Secondary | ICD-10-CM | POA: Diagnosis not present

## 2017-10-03 DIAGNOSIS — Z1159 Encounter for screening for other viral diseases: Secondary | ICD-10-CM | POA: Diagnosis not present

## 2017-10-03 DIAGNOSIS — Z125 Encounter for screening for malignant neoplasm of prostate: Secondary | ICD-10-CM | POA: Diagnosis not present

## 2017-10-03 DIAGNOSIS — E785 Hyperlipidemia, unspecified: Secondary | ICD-10-CM | POA: Diagnosis not present

## 2017-10-03 NOTE — Assessment & Plan Note (Signed)
Borderline in the office today, is checking at home with readings of 120-130's/80's. CMP pending. Continue current regimen.

## 2017-10-03 NOTE — Assessment & Plan Note (Signed)
Continues to smoke 1/2 PPD. Due for lung cancer screening, will send message to coordinator for scheduling.

## 2017-10-03 NOTE — Assessment & Plan Note (Signed)
Much improved since surgery in November 2018.

## 2017-10-03 NOTE — Progress Notes (Signed)
Patient ID: Jeffery Taylor, male   DOB: 1951/01/01, 67 y.o.   MRN: [id number]  Jeffery Taylor is a 67 year old male who presents today for [place].  HPI:  Past Medical History:  Diagnosis Date  . Hypertension   . Kidney stones   . Stroke [facility])     Current Outpatient Medications  Medication Sig Dispense Refill  . amLODipine (NORVASC) 10 MG tablet Take 1 tablet (10 mg total) by mouth daily. 90 tablet 0  . atorvastatin (LIPITOR) 10 MG tablet Take 1 tablet (10 mg total) by mouth daily. 90 tablet 3  . lisinopril-hydrochlorothiazide (PRINZIDE,ZESTORETIC) 20-25 MG tablet Take 1 tablet by mouth daily. 90 tablet 3   No current facility-administered medications for this visit.     No Known Allergies  No family history on file.  Social History   Socioeconomic History  . Marital status: Married    Spouse name: Not on file  . Number of children: Not on file  . Years of education: Not on file  . Highest education level: Not on file  Social Needs  . Financial resource strain: Not on file  . Food insecurity - worry: Not on file  . Food insecurity - inability: Not on file  . Transportation needs - medical: Not on file  . Transportation needs - non-medical: Not on file  Occupational History  . Not on file  Tobacco Use  . Smoking status: Current Every Day Smoker    Packs/day: 0.75    Years: 46.00    Pack years: 34.50    Types: Cigarettes  . Smokeless tobacco: Never Used  Substance and Sexual Activity  . Alcohol use: No    Alcohol/week: 0.0 oz  . Drug use: No  . Sexual activity: Not on file  Other Topics Concern  . Not on file  Social History Narrative   Single. In a relationship.   Works for a Engineer, technical sales.   Highest level of education High School.   Enjoys golfing, walking, collecting [place] artifacts.    Hospitiliaztions: None  Health Maintenance:    Flu: Did not receive this season.   Tetanus: Completed with 10 years.   Pneumovax: Never completed   Prevnar: Never completed,  declines  Zostavax: Never completed. Declines  Colonoscopy: Completed over 10 years ago.   Eye Doctor: Plans on scheduling   Dental Exam: No recent exam.  PSA: Due   Hep C Screening: Due    Providers: Alma Friendly, PCP; Dr. Len Childs; Orthopedics   I have personally reviewed and have noted: 1. The patient's medical and social history 2. Their use of alcohol, tobacco or illicit drugs 3. Their current medications and supplements 4. The patient's functional ability including ADL's, fall risks, home safety risks  and hearing or visual impairment. 5. Diet and physical activities 6. Evidence for depression or mood disorder  Subjective:   Review of Systems:   Constitutional: Denies fever, malaise, fatigue, headache or abrupt weight changes.  HEENT: Denies eye pain, eye redness, ear pain, ringing in the ears, wax buildup, runny nose, nasal congestion, bloody nose, or sore throat. Respiratory: Denies difficulty breathing, shortness of breath, cough or sputum production.   Cardiovascular: Denies chest pain, chest tightness, palpitations or swelling in the hands or feet.  Gastrointestinal: Denies abdominal pain, bloating, constipation, diarrhea or blood in the stool.  GU: Denies urgency, frequency, pain with urination, burning sensation, blood in urine, odor or discharge. Musculoskeletal: Denies decrease in range of motion, difficulty with gait, muscle pain or  joint pain and swelling.  Skin: Denies redness, rashes, lesions or ulcercations.  Neurological: Denies dizziness, difficulty with memory, difficulty with speech or problems with balance and coordination.  Psychiatric: Denies concerns for anxiety or depression.   No other specific complaints in a complete review of systems (except as listed in HPI above).  Objective:  PE:   BP 140/72   Pulse 68   Temp 98.2 F (36.8 C) (Oral)   Ht 5\' 8"  (1.727 m)   Wt 132 lb 8 oz (60.1 kg)   SpO2 97%   BMI 20.15 kg/m  Wt Readings from Last  3 Encounters:  10/03/17 132 lb 8 oz (60.1 kg)  10/11/16 130 lb (59 kg)  09/28/16 130 lb (59 kg)    General: Appears their stated age, well developed, well nourished in NAD. Skin: Warm, dry and intact. No rashes, lesions or ulcerations noted. HEENT: Head: normal shape and size; Eyes: sclera white, no icterus, conjunctiva pink, PERRLA and EOMs intact; Ears: Tm's gray and intact, normal light reflex; Nose: mucosa pink and moist, septum midline; Throat/Mouth: Teeth present, mucosa pink and moist, no exudate, lesions or ulcerations noted.  Neck: Normal range of motion. Neck supple, trachea midline. No massses, lumps or thyromegaly present.  Cardiovascular: Normal rate and rhythm. S1,S2 noted.  No murmur, rubs or gallops noted. No JVD or BLE edema. No carotid bruits noted. Pulmonary/Chest: Normal effort and positive vesicular breath sounds. No respiratory distress. No wheezes, rales or ronchi noted.  Abdomen: Soft and nontender. Normal bowel sounds, no bruits noted. No distention or masses noted. Liver, spleen and kidneys non palpable. Musculoskeletal: Normal range of motion. No signs of joint swelling. No difficulty with gait.  Neurological: Alert and oriented. Cranial nerves II-XII intact. Coordination normal. +DTRs bilaterally. Psychiatric: Mood and affect normal. Behavior is normal. Judgment and thought content normal.    BMET    Component Value Date/Time   NA 137 10/04/2016 0847   K 4.4 10/04/2016 0847   CL 105 10/04/2016 0847   CO2 29 10/04/2016 0847   GLUCOSE 96 10/04/2016 0847   BUN 22 10/04/2016 0847   CREATININE 1.24 10/04/2016 0847   CREATININE 1.22 11/12/2014 1902   CALCIUM 11.4 (H) 10/04/2016 0847    Lipid Panel     Component Value Date/Time   CHOL 214 (H) 10/04/2016 0847   TRIG 156.0 (H) 10/04/2016 0847   HDL 39.30 10/04/2016 0847   CHOLHDL 5 10/04/2016 0847   VLDL 31.2 10/04/2016 0847   LDLCALC 143 (H) 10/04/2016 0847    CBC No results found for: WBC, RBC, HGB,  HCT, PLT, MCV, MCH, MCHC, RDW, LYMPHSABS, MONOABS, EOSABS, BASOSABS  Hgb A1C No results found for: HGBA1C    Assessment and Plan:   Medicare Annual Wellness Visit:  Diet: He endorses  Breakfast: Oatmeal, cereal, fruit Lunch: Diner food (fried vegetables), meant Dinner: Diner food Snacks: Nuts Desserts: 2 times monthly  Beverages: Vitamin water, Ensure, water, soda Physical activity: He is active at home. Uses the stationary bike three times weekly Depression/mood screen: Negative Hearing: Intact to whispered voice Visual acuity: Grossly normal, performs annual eye exam  ADLs: Capable Fall risk: None Home safety: Good Cognitive evaluation: Intact to orientation, naming, recall and repetition EOL planning: Adv directives, full code/ I agree  Preventative Medicine: Declines immunizations. Colonoscopy due, orders placed. Lung cancer screening due, sent message to coordinator for scheduling. Discussed the importance of a healthy diet and regular exercise in order to reduce the risk of any potential medical  problems. PSA pending. Advanced directives packet provided today. Full code. All recommendations provided at end of visit. Exam unremarkable, labs pending.   Next appointment: One year.

## 2017-10-03 NOTE — Assessment & Plan Note (Signed)
Repeat labs pending, he is not fasting today.  Continue atorvastatin 10 mg.

## 2017-10-03 NOTE — Assessment & Plan Note (Signed)
Declines immunizations. Colonoscopy due, orders placed. Lung cancer screening due, sent message to coordinator for scheduling. Discussed the importance of a healthy diet and regular exercise in order to reduce the risk of any potential medical problems. PSA pending. Advanced directives packet provided today. Full code. All recommendations provided at end of visit.   I have personally reviewed and have noted: 1. The patient's medical and social history 2. Their use of alcohol, tobacco or illicit drugs 3. Their current medications and supplements 4. The patient's functional ability including ADL's, fall  risks, home safety risks and hearing or visual  impairment. 5. Diet and physical activities 6. Evidence for depression or mood disorder

## 2017-10-03 NOTE — Patient Instructions (Signed)
Continue to monitor your blood pressure and notify me of readings at or above 140/90.  It's important to improve your diet by reducing consumption of fast food, fried food, processed snack foods, sugary drinks. Increase consumption of fresh vegetables and fruits, whole grains, water.  Ensure you are drinking 64 ounces of water daily.  Continue exercising. You should be getting 150 minutes of moderate intensity exercise weekly.  You will be contacted regarding your referral to GI for the colonoscopy and lung cancer screening.  Please let us know if you have not been contacted within one week.   Schedule a lab only appointment to return fasting for labs. Make sure no food 4-8 hours prior. You may have water and black coffee only.  Schedule a follow up visit in 1 year.  It was a pleasure to see you today!

## 2017-10-04 ENCOUNTER — Telehealth: Payer: Self-pay | Admitting: *Deleted

## 2017-10-04 NOTE — Telephone Encounter (Signed)
Left message for patient to notify them that it is time to schedule annual low dose lung cancer screening CT scan. Instructed patient to call back to verify information prior to the scan being scheduled.  

## 2017-10-06 ENCOUNTER — Telehealth: Payer: Self-pay | Admitting: *Deleted

## 2017-10-06 NOTE — Telephone Encounter (Signed)
Left message for patient to notify them that it is time to schedule annual low dose lung cancer screening CT scan. Instructed patient to call back to verify information prior to the scan being scheduled.  

## 2017-10-10 ENCOUNTER — Other Ambulatory Visit (INDEPENDENT_AMBULATORY_CARE_PROVIDER_SITE_OTHER): Payer: Medicare Other

## 2017-10-10 DIAGNOSIS — Z125 Encounter for screening for malignant neoplasm of prostate: Secondary | ICD-10-CM | POA: Diagnosis not present

## 2017-10-10 DIAGNOSIS — Z1159 Encounter for screening for other viral diseases: Secondary | ICD-10-CM

## 2017-10-10 DIAGNOSIS — I10 Essential (primary) hypertension: Secondary | ICD-10-CM

## 2017-10-10 DIAGNOSIS — E785 Hyperlipidemia, unspecified: Secondary | ICD-10-CM | POA: Diagnosis not present

## 2017-10-10 LAB — COMPREHENSIVE METABOLIC PANEL
ALK PHOS: 65 U/L (ref 39–117)
ALT: 18 U/L (ref 0–53)
AST: 20 U/L (ref 0–37)
Albumin: 4.6 g/dL (ref 3.5–5.2)
BILIRUBIN TOTAL: 1.1 mg/dL (ref 0.2–1.2)
BUN: 24 mg/dL — ABNORMAL HIGH (ref 6–23)
CALCIUM: 11.4 mg/dL — AB (ref 8.4–10.5)
CO2: 27 mEq/L (ref 19–32)
Chloride: 101 mEq/L (ref 96–112)
Creatinine, Ser: 1.19 mg/dL (ref 0.40–1.50)
GFR: 64.62 mL/min (ref >60.00)
Glucose, Bld: 91 mg/dL (ref 70–99)
POTASSIUM: 4.7 meq/L (ref 3.5–5.1)
Sodium: 137 mEq/L (ref 135–145)
TOTAL PROTEIN: 7.4 g/dL (ref 6.0–8.3)

## 2017-10-10 LAB — PSA, MEDICARE: PSA: 1.13 ng/mL (ref 0.10–4.00)

## 2017-10-10 LAB — LIPID PANEL
Cholesterol: 180 mg/dL (ref 0–200)
HDL: 44.1 mg/dL (ref >39.00)
LDL Cholesterol: 109 mg/dL — ABNORMAL HIGH (ref 0–99)
NonHDL: 135.92
TRIGLYCERIDES: 136 mg/dL (ref 0.0–149.0)
Total CHOL/HDL Ratio: 4
VLDL: 27.2 mg/dL (ref 0.0–40.0)

## 2017-10-11 LAB — HEPATITIS C ANTIBODY
HEP C AB: NONREACTIVE
SIGNAL TO CUT-OFF: 0.02 (ref <1.00)

## 2017-10-12 ENCOUNTER — Other Ambulatory Visit: Payer: Self-pay | Admitting: Primary Care

## 2017-10-12 ENCOUNTER — Encounter: Payer: Self-pay | Admitting: *Deleted

## 2017-10-12 DIAGNOSIS — E785 Hyperlipidemia, unspecified: Secondary | ICD-10-CM

## 2017-10-12 DIAGNOSIS — I10 Essential (primary) hypertension: Secondary | ICD-10-CM

## 2017-10-12 MED ORDER — LISINOPRIL-HYDROCHLOROTHIAZIDE 20-25 MG PO TABS: 1.0000 | ORAL_TABLET | Freq: Every day | ORAL | 3 refills | Status: DC

## 2017-10-12 MED ORDER — ATORVASTATIN CALCIUM 10 MG PO TABS: 10.0000 mg | ORAL_TABLET | Freq: Every day | ORAL | 3 refills | Status: DC

## 2017-10-20 ENCOUNTER — Telehealth: Payer: Self-pay | Admitting: *Deleted

## 2017-10-20 NOTE — Telephone Encounter (Signed)
Left message for patient to notify them that it is time to schedule annual low dose lung cancer screening CT scan. Instructed patient to call back to verify information prior to the scan being scheduled.  

## 2017-11-01 ENCOUNTER — Encounter: Payer: Self-pay | Admitting: *Deleted

## 2017-11-10 ENCOUNTER — Encounter: Payer: Self-pay | Admitting: *Deleted

## 2017-11-18 ENCOUNTER — Ambulatory Visit (AMBULATORY_SURGERY_CENTER): Payer: Self-pay | Admitting: *Deleted

## 2017-11-18 ENCOUNTER — Other Ambulatory Visit: Payer: Self-pay

## 2017-11-18 VITALS — Ht 68.0 in | Wt 131.8 lb

## 2017-11-18 DIAGNOSIS — Z1211 Encounter for screening for malignant neoplasm of colon: Secondary | ICD-10-CM

## 2017-11-18 MED ORDER — PEG 3350-KCL-NA BICARB-NACL 420 G PO SOLR: 4000.0000 mL | Freq: Once | ORAL | 0 refills | Status: AC

## 2017-11-18 NOTE — Progress Notes (Signed)
No egg or soy allergy known to patient  No issues with past sedation with any surgeries  or procedures, no intubation problems  No diet pills per patient No home 02 use per patient  No blood thinners per patient  Pt denies issues with constipation  No A fib or A flutter  EMMI video sent to pt's e mail pt declined   

## 2017-12-02 ENCOUNTER — Other Ambulatory Visit: Payer: Self-pay

## 2017-12-02 ENCOUNTER — Encounter: Payer: Self-pay | Admitting: Gastroenterology

## 2017-12-02 ENCOUNTER — Ambulatory Visit (AMBULATORY_SURGERY_CENTER): Payer: Medicare Other | Admitting: Gastroenterology

## 2017-12-02 VITALS — BP 96/62 | HR 55 | Temp 99.8°F | Resp 15 | Ht 68.0 in | Wt 131.0 lb

## 2017-12-02 DIAGNOSIS — D125 Benign neoplasm of sigmoid colon: Secondary | ICD-10-CM | POA: Diagnosis not present

## 2017-12-02 DIAGNOSIS — Z1211 Encounter for screening for malignant neoplasm of colon: Secondary | ICD-10-CM | POA: Diagnosis not present

## 2017-12-02 DIAGNOSIS — D124 Benign neoplasm of descending colon: Secondary | ICD-10-CM | POA: Diagnosis not present

## 2017-12-02 DIAGNOSIS — D123 Benign neoplasm of transverse colon: Secondary | ICD-10-CM

## 2017-12-02 DIAGNOSIS — I10 Essential (primary) hypertension: Secondary | ICD-10-CM | POA: Diagnosis not present

## 2017-12-02 DIAGNOSIS — K635 Polyp of colon: Secondary | ICD-10-CM

## 2017-12-02 MED ORDER — SODIUM CHLORIDE 0.9 % IV SOLN: 500.0000 mL | Freq: Once | INTRAVENOUS | Status: AC

## 2017-12-02 NOTE — Patient Instructions (Signed)
INFORMATION ON POLYPS GIVEN.   YOU HAD AN ENDOSCOPIC PROCEDURE TODAY AT THE [facility]:   Refer to the procedure report that was given to you for any specific questions about what was found during the examination.  If the procedure report does not answer your questions, please call your gastroenterologist to clarify.  If you requested that your care partner not be given the details of your procedure findings, then the procedure report has been included in a sealed envelope for you to review at your convenience later.  YOU SHOULD EXPECT: Some feelings of bloating in the abdomen. Passage of more gas than usual.  Walking can help get rid of the air that was put into your GI tract during the procedure and reduce the bloating. If you had a lower endoscopy (such as a colonoscopy or flexible sigmoidoscopy) you may notice spotting of blood in your stool or on the toilet paper. If you underwent a bowel prep for your procedure, you may not have a normal bowel movement for a few days.  Please Note:  You might notice some irritation and congestion in your nose or some drainage.  This is from the oxygen used during your procedure.  There is no need for concern and it should clear up in a day or so.  SYMPTOMS TO REPORT IMMEDIATELY:   Following lower endoscopy (colonoscopy or flexible sigmoidoscopy):  Excessive amounts of blood in the stool  Significant tenderness or worsening of abdominal pains  Swelling of the abdomen that is new, acute  Fever of 100F or higher    For urgent or emergent issues, a gastroenterologist can be reached at any hour by calling [phone].   DIET:  We do recommend a small meal at first, but then you may proceed to your regular diet.  Drink plenty of fluids but you should avoid alcoholic beverages for 24 hours.  ACTIVITY:  You should plan to take it easy for the rest of today and you should NOT DRIVE or use heavy machinery until tomorrow (because of the sedation  medicines used during the test).    FOLLOW UP: Our staff will call the number listed on your records the next business day following your procedure to check on you and address any questions or concerns that you may have regarding the information given to you following your procedure. If we do not reach you, we will leave a message.  However, if you are feeling well and you are not experiencing any problems, there is no need to return our call.  We will assume that you have returned to your regular daily activities without incident.  If any biopsies were taken you will be contacted by phone or by letter within the next 1-3 weeks.  Please call us at [phone] if you have not heard about the biopsies in 3 weeks.    SIGNATURES/CONFIDENTIALITY: You and/or your care partner have signed paperwork which will be entered into your electronic medical record.  These signatures attest to the fact that that the information above on your After Visit Summary has been reviewed and is understood.  Full responsibility of the confidentiality of this discharge information lies with you and/or your care-partner. 

## 2017-12-02 NOTE — Op Note (Signed)
Jeffery Taylor Patient Name: Jeffery Taylor Procedure Date: 12/02/2017 9:14 AM MRN: [id number] Endoscopist: Mallie Mussel L. Jeffery Taylor , MD Age: 67 Referring MD:  Date of Birth: 1951/06/17 Gender: Male Account #: [id number] Procedure:                Colonoscopy Indications:              Screening for colorectal malignant neoplasm                            (patient reports last screening colonoscopy > 10                            years prior) Medicines:                Monitored Anesthesia Care Procedure:                Pre-Anesthesia Assessment:                           - Prior to the procedure, a History and Physical                            was performed, and patient medications and                            allergies were reviewed. The patient's tolerance of                            previous anesthesia was also reviewed. The risks                            and benefits of the procedure and the sedation                            options and risks were discussed with the patient.                            All questions were answered, and informed consent                            was obtained. Prior Anticoagulants: The patient has                            taken no previous anticoagulant or antiplatelet                            agents. ASA Grade Assessment: II - A patient with                            mild systemic disease. After reviewing the risks                            and benefits, the patient was deemed in  satisfactory condition to undergo the procedure.                           After obtaining informed consent, the colonoscope                            was passed under direct vision. Throughout the                            procedure, the patient's blood pressure, pulse, and                            oxygen saturations were monitored continuously. The                            Model CF-HQ190L [id number]) scope was introduced                      through the anus and advanced to the cecum,                            identified by appendiceal orifice and ileocecal                            valve. The colonoscopy was performed without                            difficulty. The patient tolerated the procedure                            well. The quality of the bowel preparation was                            good. The ileocecal valve, appendiceal orifice, and                            rectum were photographed. The quality of the bowel                            preparation was evaluated using the BBPS [facility]                            Bowel Preparation Scale) with scores of: Right                            Colon = 2, Transverse Colon = 2 and Left Colon = 2.                            The total BBPS score equals 6. Scope In: 9:19:53 AM Scope Out: 9:44:54 AM Scope Withdrawal Time: 0 hours 21 minutes 24 seconds  Total Procedure Duration: 0 hours 25 minutes 1 second  Findings:                 The perianal and digital rectal examinations were  normal.                           Four sessile polyps were found in the descending                            colon, splenic flexure, transverse colon and                            hepatic flexure. The polyps were 2 to 6 mm in size.                            These polyps were removed with a cold snare.                            Resection and retrieval were complete.                           A 8 mm polyp was found in the sigmoid colon. The                            polyp was sessile. The polyp was removed using a                            hot snare. Resection and retrieval were complete.                           Multiple small and large-mouthed diverticula were                            found in the left colon and right colon.                           The exam was otherwise without abnormality on                            direct and  retroflexion views. Complications:            No immediate complications. Estimated Blood Loss:     Estimated blood loss was minimal. Impression:               - Four 2 to 6 mm polyps in the descending colon, at                            the splenic flexure, in the transverse colon and at                            the hepatic flexure, removed with a cold snare.                            Resected and retrieved.                           - One 8 mm polyp in the  sigmoid colon, removed                            using a hot snare. Resected and retrieved.                           - Diverticulosis in the left colon and in the right                            colon.                           - The examination was otherwise normal on direct                            and retroflexion views. Recommendation:           - Patient has a contact number available for                            emergencies. The signs and symptoms of potential                            delayed complications were discussed with the                            patient. Return to normal activities tomorrow.                            Written discharge instructions were provided to the                            patient.                           - Resume previous diet.                           - Continue present medications.                           - Await pathology results.                           - Repeat colonoscopy is recommended for                            surveillance. The colonoscopy date will be                            determined after pathology results from today's                            exam become available for review. Kaisha Wachob L. Jeffery Carrow, MD 12/02/2017 9:52:46 AM This report has been signed electronically.

## 2017-12-02 NOTE — Progress Notes (Signed)
To recovery, report to RN, VSS. 

## 2017-12-02 NOTE — Progress Notes (Signed)
Called to room to assist during endoscopic procedure.  Patient ID and intended procedure confirmed with present staff. Received instructions for my participation in the procedure from the performing physician.  

## 2017-12-02 NOTE — Progress Notes (Signed)
Pt's states no medical or surgical changes since previsit or office visit. Patient said he had breakfast yesterday, stool is clear and yellow. Dr.Danis notified, he said it was ok.

## 2017-12-05 ENCOUNTER — Telehealth: Payer: Self-pay | Admitting: *Deleted

## 2017-12-05 ENCOUNTER — Telehealth: Payer: Self-pay

## 2017-12-05 NOTE — Telephone Encounter (Signed)
Attempted to reach pt. With follow-up call following endoscopic procedure 12/02/2017.  LM on pt. Ans. Machine.  Will try to reach pt. Again later today.

## 2017-12-05 NOTE — Telephone Encounter (Signed)
  Follow up Call-  Call back number 12/02/2017  Post procedure Call Back phone  # [phone]  Permission to leave phone message Yes  Some recent data might be hidden     Patient questions:  Do you have a fever, pain , or abdominal swelling? No. Pain Score  0 *  Have you tolerated food without any problems? Yes.    Have you been able to return to your normal activities? Yes.    Do you have any questions about your discharge instructions: Diet   No. Medications  No. Follow up visit  No.  Do you have questions or concerns about your Care? No.  Actions: * If pain score is 4 or above: No action needed, pain <4.

## 2017-12-06 ENCOUNTER — Encounter: Payer: Self-pay | Admitting: Gastroenterology

## 2017-12-22 ENCOUNTER — Other Ambulatory Visit: Payer: Self-pay | Admitting: Primary Care

## 2017-12-22 DIAGNOSIS — I10 Essential (primary) hypertension: Secondary | ICD-10-CM

## 2017-12-22 MED ORDER — AMLODIPINE BESYLATE 10 MG PO TABS: 10.0000 mg | ORAL_TABLET | Freq: Every day | ORAL | 1 refills | Status: DC

## 2018-06-02 ENCOUNTER — Other Ambulatory Visit: Payer: Self-pay | Admitting: Primary Care

## 2018-06-02 DIAGNOSIS — I10 Essential (primary) hypertension: Secondary | ICD-10-CM

## 2018-06-02 MED ORDER — AMLODIPINE BESYLATE 10 MG PO TABS: 10.0000 mg | ORAL_TABLET | Freq: Every day | ORAL | 1 refills | Status: DC

## 2018-10-04 ENCOUNTER — Encounter: Payer: Self-pay | Admitting: Primary Care

## 2018-10-04 ENCOUNTER — Other Ambulatory Visit: Payer: Self-pay

## 2018-10-04 ENCOUNTER — Encounter: Payer: Self-pay | Admitting: *Deleted

## 2018-10-04 ENCOUNTER — Ambulatory Visit (INDEPENDENT_AMBULATORY_CARE_PROVIDER_SITE_OTHER): Payer: Medicare Other | Admitting: Primary Care

## 2018-10-04 VITALS — BP 124/70 | HR 78 | Temp 98.2°F | Ht 68.0 in | Wt 133.5 lb

## 2018-10-04 DIAGNOSIS — Z8673 Personal history of transient ischemic attack (TIA), and cerebral infarction without residual deficits: Secondary | ICD-10-CM

## 2018-10-04 DIAGNOSIS — E785 Hyperlipidemia, unspecified: Secondary | ICD-10-CM

## 2018-10-04 DIAGNOSIS — F17200 Nicotine dependence, unspecified, uncomplicated: Secondary | ICD-10-CM

## 2018-10-04 DIAGNOSIS — Z23 Encounter for immunization: Secondary | ICD-10-CM | POA: Diagnosis not present

## 2018-10-04 DIAGNOSIS — I10 Essential (primary) hypertension: Secondary | ICD-10-CM

## 2018-10-04 LAB — COMPREHENSIVE METABOLIC PANEL
ALT: 20 U/L (ref 0–53)
AST: 25 U/L (ref 0–37)
Albumin: 4.6 g/dL (ref 3.5–5.2)
Alkaline Phosphatase: 61 U/L (ref 39–117)
BUN: 27 mg/dL — AB (ref 6–23)
CHLORIDE: 101 meq/L (ref 96–112)
CO2: 28 meq/L (ref 19–32)
CREATININE: 1.22 mg/dL (ref 0.40–1.50)
Calcium: 11 mg/dL — ABNORMAL HIGH (ref 8.4–10.5)
GFR: 58.91 mL/min — ABNORMAL LOW (ref >60.00)
Glucose, Bld: 98 mg/dL (ref 70–99)
Potassium: 4.3 mEq/L (ref 3.5–5.1)
SODIUM: 135 meq/L (ref 135–145)
Total Bilirubin: 0.7 mg/dL (ref 0.2–1.2)
Total Protein: 7.4 g/dL (ref 6.0–8.3)

## 2018-10-04 LAB — LIPID PANEL
CHOLESTEROL: 159 mg/dL (ref 0–200)
HDL: 42.7 mg/dL (ref >39.00)
LDL Cholesterol: 86 mg/dL (ref 0–99)
NONHDL: 116.28
Total CHOL/HDL Ratio: 4
Triglycerides: 151 mg/dL — ABNORMAL HIGH (ref 0.0–149.0)
VLDL: 30.2 mg/dL (ref 0.0–40.0)

## 2018-10-04 LAB — CBC
HCT: 45 % (ref 39.0–52.0)
Hemoglobin: 15.2 g/dL (ref 13.0–17.0)
MCHC: 33.9 g/dL (ref 30.0–36.0)
MCV: 90.4 fl (ref 78.0–100.0)
Platelets: 273 10*3/uL (ref 150.0–400.0)
RBC: 4.98 Mil/uL (ref 4.22–5.81)
RDW: 14.1 % (ref 11.5–15.5)
WBC: 14.2 10*3/uL — ABNORMAL HIGH (ref 4.0–10.5)

## 2018-10-04 MED ORDER — ATORVASTATIN CALCIUM 40 MG PO TABS: 40.0000 mg | ORAL_TABLET | Freq: Every day | ORAL | 3 refills | Status: DC

## 2018-10-04 NOTE — Patient Instructions (Signed)
We've increased the dose of your atorvastatin to 40 mg for stroke prevention and lowering cholesterol.   You will be contacted regarding your referral to lung cancer screening.  Please let us know if you have not been contacted within one week.   Stop by the lab prior to leaving today. I will notify you of your results once received.   Start exercising. You should be getting 150 minutes of moderate intensity exercise weekly.  It's important to improve your diet by reducing consumption of fast food, fried food, processed snack foods, sugary drinks. Increase consumption of fresh vegetables and fruits, whole grains, water.  Ensure you are drinking 64 ounces of water daily.  It was a pleasure to see you today!

## 2018-10-04 NOTE — Progress Notes (Signed)
Subjective:    Patient ID: Jeffery Taylor, male    DOB: 04-22-1951, 68 y.o.   MRN: [id number]  HPI  Mr. Walder is a 68 year old male who presents today for follow up and medication refill.  1) Essential Hypertension: Currently managed on amlodipine 10 mg and lisinopril-HCTZ 20-25 mg.  BP Readings from Last 3 Encounters:  10/04/18 124/70  12/02/17 96/62  10/03/17 140/72   2) Hyperlipidemia: History of CVA, also current tobacco user. Lipid panel in 2019 with LDL of 109 on atorvastatin 10 mg. He is compliant to atorvastatin daily.    Review of Systems  Respiratory: Negative for shortness of breath.   Cardiovascular: Negative for chest pain.  Gastrointestinal: Negative for abdominal pain, constipation and diarrhea.  Genitourinary: Negative for difficulty urinating.  Psychiatric/Behavioral: The patient is not nervous/anxious.        Past Medical History:  Diagnosis Date  . Allergy    seasonal- prn coricidin   . Arthritis    hands   . Heart murmur    born with heart murmur- out grew  . Hyperlipidemia   . Hypertension   . Kidney stones   . Stroke [facility]) 2008     Social History   Socioeconomic History  . Marital status: Married    Spouse name: Not on file  . Number of children: Not on file  . Years of education: Not on file  . Highest education level: Not on file  Occupational History  . Not on file  Social Needs  . Financial resource strain: Not on file  . Food insecurity:    Worry: Not on file    Inability: Not on file  . Transportation needs:    Medical: Not on file    Non-medical: Not on file  Tobacco Use  . Smoking status: Current Every Day Smoker    Packs/day: 0.75    Years: 46.00    Pack years: 34.50    Types: Cigarettes  . Smokeless tobacco: Never Used  Substance and Sexual Activity  . Alcohol use: No    Alcohol/week: 0.0 standard drinks  . Drug use: No  . Sexual activity: Not on file  Lifestyle  . Physical activity:    Days per week: Not on file     Minutes per session: Not on file  . Stress: Not on file  Relationships  . Social connections:    Talks on phone: Not on file    Gets together: Not on file    Attends religious service: Not on file    Active member of club or organization: Not on file    Attends meetings of clubs or organizations: Not on file    Relationship status: Not on file  . Intimate partner violence:    Fear of current or ex partner: Not on file    Emotionally abused: Not on file    Physically abused: Not on file    Forced sexual activity: Not on file  Other Topics Concern  . Not on file  Social History Narrative   Single. In a relationship.   Works for a Engineer, technical sales.   Highest level of education High School.   Enjoys golfing, walking, collecting [place] artifacts.    Past Surgical History:  Procedure Laterality Date  . APPENDECTOMY  1976  . SPINE SURGERY     discectomy- plate placed   . WRIST FRACTURE SURGERY  1990    Family History  Problem Relation Age of Onset  . Colon  cancer Neg Hx   . Colon polyps Neg Hx   . Rectal cancer Neg Hx     No Known Allergies  Current Outpatient Medications on File Prior to Visit  Medication Sig Dispense Refill  . amLODipine (NORVASC) 10 MG tablet Take 1 tablet (10 mg total) by mouth daily. 90 tablet 1  . lisinopril-hydrochlorothiazide (PRINZIDE,ZESTORETIC) 20-25 MG tablet Take 1 tablet by mouth daily. 90 tablet 3   Current Facility-Administered Medications on File Prior to Visit  Medication Dose Route Frequency Provider Last Rate Last Dose  . 0.9 %  sodium chloride infusion  500 mL Intravenous Once Nelida Meuse III, MD        BP 124/70   Pulse 78   Temp 98.2 F (36.8 C) (Oral)   Ht 5\' 8"  (1.727 m)   Wt 133 lb 8 oz (60.6 kg)   SpO2 98%   BMI 20.30 kg/m    Objective:   Physical Exam  Constitutional: He is oriented to person, place, and time. He appears well-nourished.  HENT:  Mouth/Throat: No oropharyngeal exudate.  Eyes: Pupils are equal,  round, and reactive to light. EOM are normal.  Neck: Neck supple. No thyromegaly present.  Cardiovascular: Normal rate and regular rhythm.  Respiratory: Effort normal and breath sounds normal.  GI: Soft. Bowel sounds are normal. There is no abdominal tenderness.  Neurological: He is alert and oriented to person, place, and time.  Skin: Skin is warm and dry.  Psychiatric: He has a normal mood and affect.           Assessment & Plan:

## 2018-10-04 NOTE — Assessment & Plan Note (Signed)
Repeat lipids pending. Increased atorvastatin for LDL goal of <70 given history of CVA.

## 2018-10-04 NOTE — Addendum Note (Signed)
Addended by: Jacqualin Combes on: 10/04/2018 10:17 AM   Modules accepted: Orders

## 2018-10-04 NOTE — Assessment & Plan Note (Signed)
Stable in the office today, continue current regimen. BMP pending. 

## 2018-10-04 NOTE — Assessment & Plan Note (Signed)
No new symptoms. Need to reduce LDL to goal of <70. Increase atorvastatin to 40 mg.

## 2018-10-04 NOTE — Assessment & Plan Note (Signed)
Continues. Discussed importance of tobacco cessation. Referral for lung cancer screening placed again.

## 2018-10-09 ENCOUNTER — Other Ambulatory Visit: Payer: Self-pay | Admitting: Primary Care

## 2018-10-09 DIAGNOSIS — D72829 Elevated white blood cell count, unspecified: Secondary | ICD-10-CM

## 2018-10-13 ENCOUNTER — Encounter: Payer: Self-pay | Admitting: Primary Care

## 2018-11-01 ENCOUNTER — Other Ambulatory Visit: Payer: Self-pay | Admitting: Primary Care

## 2018-11-01 DIAGNOSIS — I10 Essential (primary) hypertension: Secondary | ICD-10-CM

## 2018-11-01 MED ORDER — AMLODIPINE BESYLATE 10 MG PO TABS: 10.0000 mg | ORAL_TABLET | Freq: Every day | ORAL | 1 refills | Status: DC

## 2018-11-09 DIAGNOSIS — X509XXA Other and unspecified overexertion or strenuous movements or postures, initial encounter: Secondary | ICD-10-CM | POA: Diagnosis not present

## 2018-11-09 DIAGNOSIS — K4090 Unilateral inguinal hernia, without obstruction or gangrene, not specified as recurrent: Secondary | ICD-10-CM | POA: Diagnosis not present

## 2018-11-09 DIAGNOSIS — Y999 Unspecified external cause status: Secondary | ICD-10-CM | POA: Diagnosis not present

## 2018-11-14 DIAGNOSIS — K4090 Unilateral inguinal hernia, without obstruction or gangrene, not specified as recurrent: Secondary | ICD-10-CM | POA: Diagnosis not present

## 2018-11-29 ENCOUNTER — Other Ambulatory Visit: Payer: Self-pay | Admitting: Primary Care

## 2018-11-29 DIAGNOSIS — I10 Essential (primary) hypertension: Secondary | ICD-10-CM

## 2018-11-29 MED ORDER — LISINOPRIL-HYDROCHLOROTHIAZIDE 20-25 MG PO TABS: 1.0000 | ORAL_TABLET | Freq: Every day | ORAL | 1 refills | Status: DC

## 2019-02-13 DIAGNOSIS — K4090 Unilateral inguinal hernia, without obstruction or gangrene, not specified as recurrent: Secondary | ICD-10-CM | POA: Diagnosis not present

## 2019-02-13 DIAGNOSIS — Z01812 Encounter for preprocedural laboratory examination: Secondary | ICD-10-CM | POA: Diagnosis not present

## 2019-02-13 DIAGNOSIS — Z1159 Encounter for screening for other viral diseases: Secondary | ICD-10-CM | POA: Diagnosis not present

## 2019-02-19 DIAGNOSIS — K4090 Unilateral inguinal hernia, without obstruction or gangrene, not specified as recurrent: Secondary | ICD-10-CM | POA: Diagnosis not present

## 2019-02-19 HISTORY — PX: INGUINAL HERNIA REPAIR: SUR1180

## 2019-03-06 ENCOUNTER — Encounter: Payer: Self-pay | Admitting: Primary Care

## 2019-03-06 ENCOUNTER — Other Ambulatory Visit: Payer: Self-pay | Admitting: Primary Care

## 2019-03-06 DIAGNOSIS — I10 Essential (primary) hypertension: Secondary | ICD-10-CM

## 2019-03-06 MED ORDER — LISINOPRIL-HYDROCHLOROTHIAZIDE 20-25 MG PO TABS: 1.0000 | ORAL_TABLET | Freq: Every day | ORAL | 1 refills | Status: DC

## 2019-03-06 MED ORDER — AMLODIPINE BESYLATE 10 MG PO TABS: 10.0000 mg | ORAL_TABLET | Freq: Every day | ORAL | 1 refills | Status: DC

## 2019-08-27 ENCOUNTER — Telehealth: Payer: Self-pay | Admitting: Primary Care

## 2019-08-27 NOTE — Telephone Encounter (Signed)
Lvm asking patient to call office. Need to schedule for awv with Anda Kraft. Pt does not see wellness nurse. Needs to be in a 9:40 in march.

## 2019-09-07 ENCOUNTER — Other Ambulatory Visit: Payer: Self-pay | Admitting: Primary Care

## 2019-09-07 DIAGNOSIS — I10 Essential (primary) hypertension: Secondary | ICD-10-CM

## 2019-09-07 DIAGNOSIS — E785 Hyperlipidemia, unspecified: Secondary | ICD-10-CM

## 2019-09-07 MED ORDER — ATORVASTATIN CALCIUM 40 MG PO TABS: 40.0000 mg | ORAL_TABLET | Freq: Every day | ORAL | 0 refills | Status: DC

## 2019-09-07 MED ORDER — LISINOPRIL-HYDROCHLOROTHIAZIDE 20-25 MG PO TABS: 1.0000 | ORAL_TABLET | Freq: Every day | ORAL | 0 refills | Status: DC

## 2019-09-07 MED ORDER — AMLODIPINE BESYLATE 10 MG PO TABS: 10.0000 mg | ORAL_TABLET | Freq: Every day | ORAL | 0 refills | Status: DC

## 2019-09-07 NOTE — Telephone Encounter (Signed)
Per wife, patient have misplaced Rx and could not find it.

## 2019-09-19 DIAGNOSIS — I743 Embolism and thrombosis of arteries of the lower extremities: Secondary | ICD-10-CM | POA: Diagnosis not present

## 2019-09-19 DIAGNOSIS — I714 Abdominal aortic aneurysm, without rupture: Secondary | ICD-10-CM | POA: Diagnosis not present

## 2019-09-19 DIAGNOSIS — M545 Low back pain: Secondary | ICD-10-CM | POA: Diagnosis not present

## 2019-09-19 DIAGNOSIS — F17210 Nicotine dependence, cigarettes, uncomplicated: Secondary | ICD-10-CM | POA: Diagnosis not present

## 2019-09-19 DIAGNOSIS — N280 Ischemia and infarction of kidney: Secondary | ICD-10-CM | POA: Diagnosis not present

## 2019-09-19 DIAGNOSIS — M533 Sacrococcygeal disorders, not elsewhere classified: Secondary | ICD-10-CM | POA: Diagnosis not present

## 2019-09-19 DIAGNOSIS — N289 Disorder of kidney and ureter, unspecified: Secondary | ICD-10-CM | POA: Diagnosis not present

## 2019-09-19 DIAGNOSIS — R918 Other nonspecific abnormal finding of lung field: Secondary | ICD-10-CM | POA: Diagnosis not present

## 2019-09-19 DIAGNOSIS — M549 Dorsalgia, unspecified: Secondary | ICD-10-CM | POA: Diagnosis not present

## 2019-09-19 DIAGNOSIS — I723 Aneurysm of iliac artery: Secondary | ICD-10-CM | POA: Diagnosis not present

## 2019-09-21 DIAGNOSIS — I723 Aneurysm of iliac artery: Secondary | ICD-10-CM | POA: Diagnosis not present

## 2019-09-21 DIAGNOSIS — Z8679 Personal history of other diseases of the circulatory system: Secondary | ICD-10-CM | POA: Diagnosis not present

## 2019-09-21 DIAGNOSIS — F17210 Nicotine dependence, cigarettes, uncomplicated: Secondary | ICD-10-CM | POA: Diagnosis not present

## 2019-09-21 DIAGNOSIS — Z8249 Family history of ischemic heart disease and other diseases of the circulatory system: Secondary | ICD-10-CM | POA: Diagnosis not present

## 2019-09-21 DIAGNOSIS — I714 Abdominal aortic aneurysm, without rupture: Secondary | ICD-10-CM | POA: Diagnosis not present

## 2019-09-21 DIAGNOSIS — M5126 Other intervertebral disc displacement, lumbar region: Secondary | ICD-10-CM | POA: Diagnosis not present

## 2019-09-21 DIAGNOSIS — Z981 Arthrodesis status: Secondary | ICD-10-CM | POA: Diagnosis not present

## 2019-09-21 DIAGNOSIS — M5441 Lumbago with sciatica, right side: Secondary | ICD-10-CM | POA: Diagnosis not present

## 2019-09-21 DIAGNOSIS — M48061 Spinal stenosis, lumbar region without neurogenic claudication: Secondary | ICD-10-CM | POA: Diagnosis not present

## 2019-09-21 DIAGNOSIS — M545 Low back pain: Secondary | ICD-10-CM | POA: Diagnosis not present

## 2019-09-21 DIAGNOSIS — I10 Essential (primary) hypertension: Secondary | ICD-10-CM | POA: Diagnosis not present

## 2019-09-21 DIAGNOSIS — I708 Atherosclerosis of other arteries: Secondary | ICD-10-CM | POA: Diagnosis not present

## 2019-09-21 DIAGNOSIS — I771 Stricture of artery: Secondary | ICD-10-CM | POA: Diagnosis not present

## 2019-09-21 DIAGNOSIS — R570 Cardiogenic shock: Secondary | ICD-10-CM | POA: Diagnosis not present

## 2019-09-21 DIAGNOSIS — N280 Ischemia and infarction of kidney: Secondary | ICD-10-CM | POA: Diagnosis not present

## 2019-09-24 DIAGNOSIS — M48061 Spinal stenosis, lumbar region without neurogenic claudication: Secondary | ICD-10-CM | POA: Diagnosis not present

## 2019-09-24 DIAGNOSIS — M5416 Radiculopathy, lumbar region: Secondary | ICD-10-CM | POA: Diagnosis not present

## 2019-09-24 DIAGNOSIS — M47816 Spondylosis without myelopathy or radiculopathy, lumbar region: Secondary | ICD-10-CM | POA: Diagnosis not present

## 2019-09-24 DIAGNOSIS — M5441 Lumbago with sciatica, right side: Secondary | ICD-10-CM | POA: Diagnosis not present

## 2019-09-24 MED ORDER — GENERIC EXTERNAL MEDICATION
Status: DC
Start: 2019-09-24 — End: 2019-09-24

## 2019-09-24 MED ORDER — CYCLOBENZAPRINE HCL 10 MG PO TABS
10.00 | ORAL_TABLET | ORAL | Status: DC
Start: ? — End: 2019-09-24

## 2019-09-24 MED ORDER — ASPIRIN 81 MG PO TBEC
81.00 | DELAYED_RELEASE_TABLET | ORAL | Status: DC
Start: 2019-09-24 — End: 2019-09-24

## 2019-09-24 MED ORDER — MELATONIN 3 MG PO TABS
3.00 | ORAL_TABLET | ORAL | Status: DC
Start: ? — End: 2019-09-24

## 2019-09-24 MED ORDER — AMLODIPINE BESYLATE 5 MG PO TABS
10.00 | ORAL_TABLET | ORAL | Status: DC
Start: 2019-09-24 — End: 2019-09-24

## 2019-09-24 MED ORDER — ATORVASTATIN CALCIUM 40 MG PO TABS
40.00 | ORAL_TABLET | ORAL | Status: DC
Start: 2019-09-23 — End: 2019-09-24

## 2019-09-24 MED ORDER — GABAPENTIN 300 MG PO CAPS
300.00 | ORAL_CAPSULE | ORAL | Status: DC
Start: 2019-09-23 — End: 2019-09-24

## 2019-09-24 MED ORDER — OXYCODONE HCL 5 MG PO TABS
5.00 | ORAL_TABLET | ORAL | Status: DC
Start: ? — End: 2019-09-24

## 2019-09-24 MED ORDER — ONDANSETRON HCL 4 MG/2ML IJ SOLN
4.00 | INTRAMUSCULAR | Status: DC
Start: ? — End: 2019-09-24

## 2019-09-24 MED ORDER — ENOXAPARIN SODIUM 40 MG/0.4ML ~~LOC~~ SOLN
40.00 | SUBCUTANEOUS | Status: DC
Start: 2019-09-23 — End: 2019-09-24

## 2019-09-24 MED ORDER — DSS 100 MG PO CAPS
100.00 | ORAL_CAPSULE | ORAL | Status: DC
Start: ? — End: 2019-09-24

## 2019-09-24 MED ORDER — ACETAMINOPHEN 325 MG PO TABS
650.00 | ORAL_TABLET | ORAL | Status: DC
Start: ? — End: 2019-09-24

## 2019-09-24 MED ORDER — DULOXETINE HCL 20 MG PO CPEP
40.00 | ORAL_CAPSULE | ORAL | Status: DC
Start: 2019-09-24 — End: 2019-09-24

## 2019-09-24 MED ORDER — BISACODYL 5 MG PO TBEC
10.00 | DELAYED_RELEASE_TABLET | ORAL | Status: DC
Start: ? — End: 2019-09-24

## 2019-09-24 MED ORDER — GENERIC EXTERNAL MEDICATION
Status: DC
Start: ? — End: 2019-09-24

## 2019-10-01 ENCOUNTER — Ambulatory Visit: Payer: Medicare Other | Admitting: Primary Care

## 2019-10-08 ENCOUNTER — Ambulatory Visit: Payer: Medicare Other | Admitting: Primary Care

## 2019-10-12 ENCOUNTER — Ambulatory Visit (INDEPENDENT_AMBULATORY_CARE_PROVIDER_SITE_OTHER): Payer: Medicare Other | Admitting: Primary Care

## 2019-10-12 ENCOUNTER — Other Ambulatory Visit: Payer: Self-pay

## 2019-10-12 ENCOUNTER — Encounter: Payer: Self-pay | Admitting: Primary Care

## 2019-10-12 VITALS — BP 146/90 | HR 80 | Temp 97.0°F | Ht 68.0 in | Wt 120.0 lb

## 2019-10-12 DIAGNOSIS — M5441 Lumbago with sciatica, right side: Secondary | ICD-10-CM | POA: Insufficient documentation

## 2019-10-12 DIAGNOSIS — M48061 Spinal stenosis, lumbar region without neurogenic claudication: Secondary | ICD-10-CM | POA: Diagnosis not present

## 2019-10-12 DIAGNOSIS — I10 Essential (primary) hypertension: Secondary | ICD-10-CM

## 2019-10-12 DIAGNOSIS — Z122 Encounter for screening for malignant neoplasm of respiratory organs: Secondary | ICD-10-CM

## 2019-10-12 DIAGNOSIS — E785 Hyperlipidemia, unspecified: Secondary | ICD-10-CM

## 2019-10-12 DIAGNOSIS — F17200 Nicotine dependence, unspecified, uncomplicated: Secondary | ICD-10-CM

## 2019-10-12 DIAGNOSIS — I719 Aortic aneurysm of unspecified site, without rupture: Secondary | ICD-10-CM | POA: Diagnosis not present

## 2019-10-12 MED ORDER — LIDOCAINE 5 % EX PTCH: MEDICATED_PATCH | CUTANEOUS | 0 refills | Status: AC

## 2019-10-12 MED ORDER — GABAPENTIN 300 MG PO CAPS: 300.0000 mg | ORAL_CAPSULE | Freq: Two times a day (BID) | ORAL | 0 refills | Status: AC

## 2019-10-12 MED ORDER — DULOXETINE HCL 60 MG PO CPEP: 60.0000 mg | ORAL_CAPSULE | Freq: Every day | ORAL | 0 refills | Status: AC

## 2019-10-12 MED ORDER — AMLODIPINE BESYLATE 10 MG PO TABS: 10.0000 mg | ORAL_TABLET | Freq: Every day | ORAL | 3 refills | Status: AC

## 2019-10-12 MED ORDER — ATORVASTATIN CALCIUM 40 MG PO TABS: 40.0000 mg | ORAL_TABLET | Freq: Every day | ORAL | 3 refills | Status: DC

## 2019-10-12 MED ORDER — LISINOPRIL-HYDROCHLOROTHIAZIDE 20-25 MG PO TABS: 1.0000 | ORAL_TABLET | Freq: Every day | ORAL | 3 refills | Status: DC

## 2019-10-12 NOTE — Assessment & Plan Note (Signed)
Has significantly cut back since recent hospital visit.  Encourage cessation.  Referral placed back for lung cancer screening as he has not been since 2018.

## 2019-10-12 NOTE — Progress Notes (Signed)
Subjective:    Patient ID: Jeffery Taylor, male    DOB: 1951/07/02, 69 y.o.   MRN: [id number]  HPI  This visit occurred during the SARS-CoV-2 public health emergency.  Safety protocols were in place, including screening questions prior to the visit, additional usage of staff PPE, and extensive cleaning of exam room while observing appropriate contact time as indicated for disinfecting solutions.   Jeffery Taylor is a 69 year old male with a history of tobacco abuse, right sided back pain with sciatica, hypertension, infrarenal aortic aneurysm who presents today for hospital follow up.  He presented to the ED in [facility] on 09/19/19 with a chief complaint of right sided lower back pain with radiculopathy. Imaging showed potential aortic and iliac aneurysm with concern for dissection so he was transferred to [facility].  After further evaluation at [facility], it was determined that his back pain was musculoskeletal in etiology.  He was discharged home later that day.  He returned to the emergency department on 09/21/2019 with continued reports of right lower back pain with radiculopathy.  He was admitted for further evaluation.  Imaging revealed spinal stenosis at L4-L5.  No symptoms of cauda equina syndrome.  Neurosurgery consulted who initiated gabapentin 300 mg twice daily, duloxetine 60 mg, Medrol Dosepak.  Also provided oxycodone.  Neurosurgery did not feel the need for inpatient treatment so he was discharged home with recommendations for outpatient follow-up including pain management.  Since his emergency department and hospital stay he continues to notice intermittent right lower back pain with radiculopathy.  He is nearly out of gabapentin, duloxetine, lidocaine patches.  He has an appointment scheduled with pain management for next week.  Incidental finding of 3.7 cm infrarenal aortic aneurysm during imaging.  Recommendation was for repeat ultrasound in 2023.  He ran out of his  atorvastatin "a while ago". He is compliant to his amlodipine and lisinopril-hydrochlorothiazide.  BP Readings from Last 3 Encounters:  10/12/19 (!) 146/90  10/04/18 124/70  12/02/17 96/62     Review of Systems  Eyes: Negative for visual disturbance.  Respiratory: Negative for shortness of breath.   Cardiovascular: Negative for chest pain.  Musculoskeletal: Positive for back pain and myalgias.  Neurological: Positive for numbness. Negative for dizziness and headaches.       Past Medical History:  Diagnosis Date  . Allergy    seasonal- prn coricidin   . Arthritis    hands   . Heart murmur    born with heart murmur- out grew  . Hyperlipidemia   . Hypertension   . Kidney stones   . Stroke [facility]) 2008     Social History   Socioeconomic History  . Marital status: Married    Spouse name: Not on file  . Number of children: Not on file  . Years of education: Not on file  . Highest education level: Not on file  Occupational History  . Not on file  Tobacco Use  . Smoking status: Current Every Day Smoker    Packs/day: 0.75    Years: 46.00    Pack years: 34.50    Types: Cigarettes  . Smokeless tobacco: Never Used  Substance and Sexual Activity  . Alcohol use: No    Alcohol/week: 0.0 standard drinks  . Drug use: No  . Sexual activity: Not on file  Other Topics Concern  . Not on file  Social History Narrative   Single. In a relationship.   Works for a Engineer, technical sales.  Highest level of education High School.   Enjoys golfing, walking, collecting [place] artifacts.   Social Determinants of Health   Financial Resource Strain:   . Difficulty of Paying Living Expenses:   Food Insecurity:   . Worried About Charity fundraiser in the Last Year:   . Arboriculturist in the Last Year:   Transportation Needs:   . Film/video editor (Medical):   Marland Kitchen Lack of Transportation (Non-Medical):   Physical Activity:   . Days of Exercise per Week:   . Minutes of Exercise per  Session:   Stress:   . Feeling of Stress :   Social Connections:   . Frequency of Communication with Friends and Family:   . Frequency of Social Gatherings with Friends and Family:   . Attends Religious Services:   . Active Member of Clubs or Organizations:   . Attends Archivist Meetings:   Marland Kitchen Marital Status:   Intimate Partner Violence:   . Fear of Current or Ex-Partner:   . Emotionally Abused:   Marland Kitchen Physically Abused:   . Sexually Abused:     Past Surgical History:  Procedure Laterality Date  . APPENDECTOMY  1976  . INGUINAL HERNIA REPAIR  02/19/2019  . SPINE SURGERY     discectomy- plate placed   . WRIST FRACTURE SURGERY  1990    Family History  Problem Relation Age of Onset  . Colon cancer Neg Hx   . Colon polyps Neg Hx   . Rectal cancer Neg Hx     No Known Allergies  Current Outpatient Medications on File Prior to Visit  Medication Sig Dispense Refill  . aspirin 81 MG EC tablet Take by mouth.     Current Facility-Administered Medications on File Prior to Visit  Medication Dose Route Frequency Provider Last Rate Last Admin  . 0.9 %  sodium chloride infusion  500 mL Intravenous Once Nelida Meuse III, MD        BP (!) 146/90   Pulse 80   Temp (!) 97 F (36.1 C) (Temporal)   Ht 5\' 8"  (1.727 m)   Wt 120 lb (54.4 kg)   SpO2 98%   BMI 18.25 kg/m    Objective:   Physical Exam  Constitutional: He appears well-nourished.  Cardiovascular: Normal rate and regular rhythm.  Respiratory: Effort normal and breath sounds normal.  Musculoskeletal:     Cervical back: Neck supple.  Skin: Skin is warm and dry.  Psychiatric: He has a normal mood and affect.           Assessment & Plan:

## 2019-10-12 NOTE — Assessment & Plan Note (Signed)
Infrarenal aortic aneurysm, 3.7 cm.  Incidental finding during hospital visit for back pain.  See care everywhere.  Ultrasound due in 2023 per recommendations.

## 2019-10-12 NOTE — Assessment & Plan Note (Signed)
Recent emergency department in hospital admission for back pain.  Back pain proved to be MSK in etiology.  Agreed to refill gabapentin, lidocaine patches, duloxetine.  I discussed that I will not refill narcotics.  Follow-up with pain management as scheduled.  All hospital notes, labs, imaging reviewed.

## 2019-10-12 NOTE — Patient Instructions (Addendum)
Follow up with the pain management doctor as scheduled.  Continue to take the duloxetine for pain and gabapentin for pain, talk with the pain doctor about continuing these.  Resume atorvastatin 40 mg for cholesterol and heart protection.  Continue your blood pressure medications.    Schedule a lab appointment for cholesterol check for 2 months.  It was a pleasure to see you today!

## 2019-10-12 NOTE — Assessment & Plan Note (Signed)
Above goal on today's exam, he is an active pain due to his back.  Refills provided for blood pressure medication, no changes made today.  I recommended that he monitor his blood pressure at home with possible, report persistent elevated readings.  Reviewed CMP from hospital visits through care everywhere.

## 2019-10-12 NOTE — Assessment & Plan Note (Signed)
Has been off of statin therapy for several months, discussed the importance of compliance.  Resume atorvastatin, repeat lipids in 2 months.

## 2019-10-16 DIAGNOSIS — M5441 Lumbago with sciatica, right side: Secondary | ICD-10-CM | POA: Diagnosis not present

## 2019-10-16 DIAGNOSIS — M48061 Spinal stenosis, lumbar region without neurogenic claudication: Secondary | ICD-10-CM | POA: Diagnosis not present

## 2019-10-25 ENCOUNTER — Ambulatory Visit: Payer: Medicare Other | Admitting: Primary Care

## 2019-11-08 DIAGNOSIS — M5416 Radiculopathy, lumbar region: Secondary | ICD-10-CM | POA: Diagnosis not present

## 2019-11-08 DIAGNOSIS — M47816 Spondylosis without myelopathy or radiculopathy, lumbar region: Secondary | ICD-10-CM | POA: Diagnosis not present

## 2019-11-08 DIAGNOSIS — M47896 Other spondylosis, lumbar region: Secondary | ICD-10-CM | POA: Diagnosis not present

## 2019-12-11 DIAGNOSIS — Z9889 Other specified postprocedural states: Secondary | ICD-10-CM | POA: Diagnosis not present

## 2019-12-11 DIAGNOSIS — M5441 Lumbago with sciatica, right side: Secondary | ICD-10-CM | POA: Diagnosis not present

## 2019-12-11 DIAGNOSIS — Z79899 Other long term (current) drug therapy: Secondary | ICD-10-CM | POA: Diagnosis not present

## 2020-05-22 DIAGNOSIS — M48061 Spinal stenosis, lumbar region without neurogenic claudication: Secondary | ICD-10-CM | POA: Diagnosis not present

## 2020-05-22 DIAGNOSIS — M5416 Radiculopathy, lumbar region: Secondary | ICD-10-CM | POA: Diagnosis not present

## 2020-05-22 DIAGNOSIS — Z79899 Other long term (current) drug therapy: Secondary | ICD-10-CM | POA: Diagnosis not present

## 2020-06-04 DIAGNOSIS — M5416 Radiculopathy, lumbar region: Secondary | ICD-10-CM | POA: Diagnosis not present

## 2020-06-04 DIAGNOSIS — M5116 Intervertebral disc disorders with radiculopathy, lumbar region: Secondary | ICD-10-CM | POA: Diagnosis not present

## 2021-03-22 ENCOUNTER — Encounter: Payer: Self-pay | Admitting: Gastroenterology

## 2021-04-07 DIAGNOSIS — H25813 Combined forms of age-related cataract, bilateral: Secondary | ICD-10-CM | POA: Diagnosis not present

## 2021-07-03 DIAGNOSIS — Z23 Encounter for immunization: Secondary | ICD-10-CM | POA: Diagnosis not present

## 2021-10-12 DIAGNOSIS — M79604 Pain in right leg: Secondary | ICD-10-CM | POA: Diagnosis not present

## 2021-10-12 DIAGNOSIS — M48061 Spinal stenosis, lumbar region without neurogenic claudication: Secondary | ICD-10-CM | POA: Diagnosis not present

## 2021-10-16 ENCOUNTER — Ambulatory Visit: Payer: Medicare Other | Admitting: Medical

## 2021-10-29 DIAGNOSIS — F17210 Nicotine dependence, cigarettes, uncomplicated: Secondary | ICD-10-CM | POA: Diagnosis not present

## 2021-10-29 DIAGNOSIS — M5416 Radiculopathy, lumbar region: Secondary | ICD-10-CM | POA: Diagnosis not present

## 2021-10-29 DIAGNOSIS — M48061 Spinal stenosis, lumbar region without neurogenic claudication: Secondary | ICD-10-CM | POA: Diagnosis not present

## 2021-10-29 DIAGNOSIS — I160 Hypertensive urgency: Secondary | ICD-10-CM | POA: Diagnosis not present

## 2021-10-29 DIAGNOSIS — Z79899 Other long term (current) drug therapy: Secondary | ICD-10-CM | POA: Diagnosis not present

## 2021-11-20 ENCOUNTER — Encounter: Payer: Self-pay | Admitting: Medical

## 2021-11-20 ENCOUNTER — Ambulatory Visit (INDEPENDENT_AMBULATORY_CARE_PROVIDER_SITE_OTHER): Payer: Medicare Other | Admitting: Medical

## 2021-11-20 VITALS — BP 170/78 | HR 69 | Temp 98.2°F | Resp 18 | Ht 68.0 in | Wt 110.8 lb

## 2021-11-20 DIAGNOSIS — R413 Other amnesia: Secondary | ICD-10-CM | POA: Diagnosis not present

## 2021-11-20 DIAGNOSIS — I10 Essential (primary) hypertension: Secondary | ICD-10-CM | POA: Diagnosis not present

## 2021-11-20 DIAGNOSIS — E785 Hyperlipidemia, unspecified: Secondary | ICD-10-CM | POA: Diagnosis not present

## 2021-11-20 DIAGNOSIS — Z8673 Personal history of transient ischemic attack (TIA), and cerebral infarction without residual deficits: Secondary | ICD-10-CM

## 2021-11-20 DIAGNOSIS — E876 Hypokalemia: Secondary | ICD-10-CM | POA: Diagnosis not present

## 2021-11-20 DIAGNOSIS — M48061 Spinal stenosis, lumbar region without neurogenic claudication: Secondary | ICD-10-CM | POA: Diagnosis not present

## 2021-11-20 LAB — COMPREHENSIVE METABOLIC PANEL
ALT: 13 U/L (ref 0–53)
AST: 19 U/L (ref 0–37)
Albumin: 4.1 g/dL (ref 3.5–5.2)
Alkaline Phosphatase: 78 U/L (ref 39–117)
BUN: 18 mg/dL (ref 6–23)
CO2: 33 mEq/L — ABNORMAL HIGH (ref 19–32)
Calcium: 10.8 mg/dL — ABNORMAL HIGH (ref 8.4–10.5)
Chloride: 99 mEq/L (ref 96–112)
Creatinine, Ser: 0.89 mg/dL (ref 0.40–1.50)
GFR: 85.87 mL/min (ref >60.00)
Glucose, Bld: 89 mg/dL (ref 70–99)
Potassium: 3.3 mEq/L — ABNORMAL LOW (ref 3.5–5.1)
Sodium: 138 mEq/L (ref 135–145)
Total Bilirubin: 1 mg/dL (ref 0.2–1.2)
Total Protein: 6.6 g/dL (ref 6.0–8.3)

## 2021-11-20 LAB — LIPID PANEL
Cholesterol: 160 mg/dL (ref 0–200)
HDL: 53.3 mg/dL (ref >39.00)
LDL Cholesterol: 91 mg/dL (ref 0–99)
NonHDL: 106.65
Total CHOL/HDL Ratio: 3
Triglycerides: 80 mg/dL (ref 0.0–149.0)
VLDL: 16 mg/dL (ref 0.0–40.0)

## 2021-11-20 MED ORDER — DONEPEZIL HCL 5 MG PO TABS: 5.0000 mg | ORAL_TABLET | Freq: Every day | ORAL | 3 refills | Status: AC

## 2021-11-20 MED ORDER — LISINOPRIL-HYDROCHLOROTHIAZIDE 20-25 MG PO TABS: 1.0000 | ORAL_TABLET | Freq: Every day | ORAL | 3 refills | Status: DC

## 2021-11-20 MED ORDER — LISINOPRIL-HYDROCHLOROTHIAZIDE 20-25 MG PO TABS: 1.0000 | ORAL_TABLET | Freq: Every day | ORAL | 3 refills | Status: AC

## 2021-11-20 NOTE — Progress Notes (Signed)
? ?Subjective:  ? ? Patient ID: Jeffery Taylor, male    DOB: Jun 15, 1951, 71 y.o.   MRN: [id number] ? ?HPI ? ?Pt in for first visit. Pt was seeing NP in group. ? ?Pt has htn- his blood is high today. He was on amlodipine 10 mg daily and zestoretic. 20/25 1 tab po q day. Today no gross motor or sensory deficits. No chest pain. ? ?He went to ED for urgency but only given amlodipine 10 mg daily.  They did give th zestoretic.  ? ?He has recently been taking the amlodipine. ? ?Also had high cholesterol. On atorvastatin 40 mg daily. ? ?Spinal stenosis and back pain. Pt seeing pain med MD for this. He got back injection on October 29, 2021. Also on lidoderm patch. ? ?Pt has history of stroke in past. Pt thinks has some memory issues. Wife agrees. Pt taking over the counter prevagen. Pt states one time driving in familiar area that he has traveled before but he felt lost and confused coming back from house he used to live. Sometimes forgets names of persons who he should remember. Wife and pt down play memory changes.  ? ? ? ? ? ? ? ?Review of Systems  ?Constitutional:  Negative for chills, fatigue and fever.  ?Respiratory:  Negative for cough, chest tightness and wheezing.   ?Cardiovascular:  Negative for chest pain and palpitations.  ?Gastrointestinal:  Negative for abdominal pain.  ?Musculoskeletal:  Positive for back pain.  ?Skin:  Negative for rash.  ?Neurological:  Negative for dizziness, numbness and headaches.  ?Hematological:  Negative for adenopathy. Does not bruise/bleed easily.  ?Psychiatric/Behavioral:  Negative for behavioral problems, dysphoric mood and suicidal ideas. The patient is not nervous/anxious.   ?     Memory changes.  ? ? ?Past Medical History:  ?Diagnosis Date  ? Allergy   ? seasonal- prn coricidin   ? Arthritis   ? hands   ? Heart murmur   ? born with heart murmur- out grew  ? Hyperlipidemia   ? Hypertension   ? Kidney stones   ? Stroke [facility]) 2008  ? ?  ?Social History  ? ?Socioeconomic History  ?  Marital status: Married  ?  Spouse name: Not on file  ? Number of children: Not on file  ? Years of education: Not on file  ? Highest education level: Not on file  ?Occupational History  ? Not on file  ?Tobacco Use  ? Smoking status: Every Day  ?  Packs/day: 0.75  ?  Years: 46.00  ?  Pack years: 34.50  ?  Types: Cigarettes  ? Smokeless tobacco: Never  ?Vaping Use  ? Vaping Use: Never used  ?Substance and Sexual Activity  ? Alcohol use: No  ?  Alcohol/week: 0.0 standard drinks  ? Drug use: No  ? Sexual activity: Not on file  ?Other Topics Concern  ? Not on file  ?Social History Narrative  ? Single. In a relationship.  ? Works for a [employer], technical sales.  ? Highest level of education High School.  ? Enjoys golfing, walking, collecting [place] artifacts.  ? ?Social Determinants of Health  ? ?Financial Resource Strain: Not on file  ?Food Insecurity: Not on file  ?Transportation Needs: Not on file  ?Physical Activity: Not on file  ?Stress: Not on file  ?Social Connections: Not on file  ?Intimate Partner Violence: Not on file  ? ? ?Past Surgical History:  ?Procedure Laterality Date  ? APPENDECTOMY  1976  ? INGUINAL  HERNIA REPAIR  02/19/2019  ? SPINE SURGERY    ? discectomy- plate placed   ? WRIST FRACTURE SURGERY  1990  ? ? ?Family History  ?Problem Relation Age of Onset  ? Colon cancer Neg Hx   ? Colon polyps Neg Hx   ? Rectal cancer Neg Hx   ? ? ?No Known Allergies ? ?Current Outpatient Medications on File Prior to Visit  ?Medication Sig Dispense Refill  ? amLODipine (NORVASC) 10 MG tablet Take 1 tablet (10 mg total) by mouth daily. For blood pressure. 90 tablet 3  ? atorvastatin (LIPITOR) 40 MG tablet Take 1 tablet (40 mg total) by mouth daily. For cholesterol. 90 tablet 3  ? lidocaine (LIDODERM) 5 % Apply patch to painful area. Patch may remain in place for up to 12 hours in a 24 hour period. 30 patch 0  ? lisinopril-hydrochlorothiazide (ZESTORETIC) 20-25 MG tablet Take 1 tablet by mouth daily. For blood pressure. 90 tablet  3  ? DULoxetine (CYMBALTA) 60 MG capsule Take 1 capsule (60 mg total) by mouth daily. For pain. 30 capsule 0  ? gabapentin (NEURONTIN) 300 MG capsule Take 1 capsule (300 mg total) by mouth 2 (two) times daily. For back pain. 60 capsule 0  ? ?Current Facility-Administered Medications on File Prior to Visit  ?Medication Dose Route Frequency Provider Last Rate Last Admin  ? 0.9 %  sodium chloride infusion  500 mL Intravenous Once Nelida Meuse III, MD      ? ? ?BP (!) 186/90   Pulse 69   Temp 98.2 ?F (36.8 ?C)   Resp 18   Ht '5\' 8"'$  (1.727 m)   Wt 110 lb 12.8 oz (50.3 kg)   SpO2 98%   BMI 16.85 kg/m?  ?  ?   ?Objective:  ? Physical Exam ? ?General- No acute distress. Pleasant patient. ?Neck- Full range of motion, no jvd ?Lungs- Clear, even and unlabored. ?Heart- regular rate and rhythm. ?Neurologic- CNII- XII grossly intact.  ? ? ?   ?Assessment & Plan:  ? ?Patient Instructions  ?Htn- bp is high today at 170/78. Will place you back on your zestoretic and have you continue amlodipine. With this level today proceed with caution. If you have any cardiac or neurologic signs/symptoms then recommend bp evaluation. Want you to follow up wed in office visit to recheck bp. You can also check bp daily and bring those readings. ? ?High cholesterol and hx of stroke-  Will get cmp and lipid panel today. Then review/likely put you back on same dose atorvastatin. ? ?Form memory changes you had Mini- mental status score of 25/30. Your getting loss episodes does cause some concern. Maybe early demenitia. We can have you start aricept and if memory worsens then refer you to specialist. ? ? ?Follow up office visit 5 days or sooner if needed. ? ?  ?Mackie Pai, PA-C  ? ?Time spent with patient today was 51  minutes which consisted of chart review, discussing diagnosis, work up, treatment, doing minimental status exam  and documentation.  ?

## 2021-11-20 NOTE — Addendum Note (Signed)
Addended by: Anabel Halon on: 11/20/2021 10:07 AM ? ? Modules accepted: Orders ? ?

## 2021-11-20 NOTE — Patient Instructions (Addendum)
Htn- bp is high today at 170/78. Will place you back on your zestoretic and have you continue amlodipine. With this level today proceed with caution. If you have any cardiac or neurologic signs/symptoms then recommend bp evaluation. Want you to follow up wed in office visit to recheck bp. You can also check bp daily and bring those readings. ? ?High cholesterol and hx of stroke-  Will get cmp and lipid panel today. Then review/likely put you back on same dose atorvastatin. ? ?Form memory changes you had Mini- mental status score of 25/30. Your getting loss episodes does cause some concern. Maybe early demenitia. We can have you start aricept and if memory worsens then refer you to specialist. ? ? ?Follow up office visit 5 days or sooner if needed. ? ? ?

## 2021-11-21 MED ORDER — ATORVASTATIN CALCIUM 40 MG PO TABS: 40.0000 mg | ORAL_TABLET | Freq: Every day | ORAL | 3 refills | Status: AC

## 2021-11-21 MED ORDER — POTASSIUM CHLORIDE CRYS ER 10 MEQ PO TBCR: 10.0000 meq | EXTENDED_RELEASE_TABLET | Freq: Every day | ORAL | 0 refills | Status: AC

## 2021-11-21 NOTE — Addendum Note (Signed)
Addended by: Anabel Halon on: 11/21/2021 08:29 AM ? ? Modules accepted: Orders ? ?

## 2021-11-21 NOTE — Addendum Note (Signed)
Addended by: Anabel Halon on: 11/21/2021 08:25 AM ? ? Modules accepted: Orders ? ?

## 2021-11-21 NOTE — Addendum Note (Signed)
Addended by: Anabel Halon on: 11/21/2021 08:24 AM ? ? Modules accepted: Orders ? ?

## 2021-11-27 ENCOUNTER — Ambulatory Visit: Payer: Medicare Other | Admitting: Medical

## 2021-11-27 ENCOUNTER — Telehealth: Payer: Self-pay | Admitting: Medical

## 2021-11-27 NOTE — Telephone Encounter (Signed)
FYI

## 2021-11-27 NOTE — Telephone Encounter (Signed)
Pt stated he needed to cancel his appt today because he feels his last appt impacted his shoulder. He explained the last time he was here Percell Miller was moving his shoulder, pt did not explain his shoulder was hurting him but felt "cracking" noises. Now he states his shoulder is broke and he does not want to continue care here. Pt wanted a message sent explaining this to pcp.  ?

## 2023-02-11 DIAGNOSIS — M25562 Pain in left knee: Secondary | ICD-10-CM | POA: Diagnosis not present

## 2023-02-11 DIAGNOSIS — M25561 Pain in right knee: Secondary | ICD-10-CM | POA: Diagnosis not present

## 2023-02-11 DIAGNOSIS — R262 Difficulty in walking, not elsewhere classified: Secondary | ICD-10-CM | POA: Diagnosis not present

## 2023-02-11 DIAGNOSIS — M170 Bilateral primary osteoarthritis of knee: Secondary | ICD-10-CM | POA: Diagnosis not present

## 2023-02-18 DIAGNOSIS — R262 Difficulty in walking, not elsewhere classified: Secondary | ICD-10-CM | POA: Diagnosis not present

## 2023-02-18 DIAGNOSIS — M170 Bilateral primary osteoarthritis of knee: Secondary | ICD-10-CM | POA: Diagnosis not present

## 2023-02-18 DIAGNOSIS — M25561 Pain in right knee: Secondary | ICD-10-CM | POA: Diagnosis not present

## 2023-02-18 DIAGNOSIS — M25562 Pain in left knee: Secondary | ICD-10-CM | POA: Diagnosis not present

## 2023-02-25 DIAGNOSIS — M25562 Pain in left knee: Secondary | ICD-10-CM | POA: Diagnosis not present

## 2023-02-25 DIAGNOSIS — M170 Bilateral primary osteoarthritis of knee: Secondary | ICD-10-CM | POA: Diagnosis not present

## 2023-02-25 DIAGNOSIS — R262 Difficulty in walking, not elsewhere classified: Secondary | ICD-10-CM | POA: Diagnosis not present

## 2023-02-25 DIAGNOSIS — M25561 Pain in right knee: Secondary | ICD-10-CM | POA: Diagnosis not present

## 2023-11-23 DIAGNOSIS — R0602 Shortness of breath: Secondary | ICD-10-CM | POA: Diagnosis not present

## 2023-11-23 DIAGNOSIS — G9341 Metabolic encephalopathy: Secondary | ICD-10-CM | POA: Diagnosis not present

## 2023-11-23 DIAGNOSIS — I7143 Infrarenal abdominal aortic aneurysm, without rupture: Secondary | ICD-10-CM | POA: Diagnosis not present

## 2023-11-23 DIAGNOSIS — I6502 Occlusion and stenosis of left vertebral artery: Secondary | ICD-10-CM | POA: Diagnosis not present

## 2023-11-23 DIAGNOSIS — R1312 Dysphagia, oropharyngeal phase: Secondary | ICD-10-CM | POA: Diagnosis not present

## 2023-11-23 DIAGNOSIS — R569 Unspecified convulsions: Secondary | ICD-10-CM | POA: Diagnosis present

## 2023-11-23 DIAGNOSIS — F01511 Vascular dementia, unspecified severity, with agitation: Secondary | ICD-10-CM | POA: Diagnosis not present

## 2023-11-23 DIAGNOSIS — I21A1 Myocardial infarction type 2: Secondary | ICD-10-CM | POA: Diagnosis present

## 2023-11-23 DIAGNOSIS — Z91148 Patient's other noncompliance with medication regimen for other reason: Secondary | ICD-10-CM | POA: Diagnosis not present

## 2023-11-23 DIAGNOSIS — F17210 Nicotine dependence, cigarettes, uncomplicated: Secondary | ICD-10-CM | POA: Diagnosis not present

## 2023-11-23 DIAGNOSIS — F0150 Vascular dementia without behavioral disturbance: Secondary | ICD-10-CM | POA: Diagnosis present

## 2023-11-23 DIAGNOSIS — R4182 Altered mental status, unspecified: Secondary | ICD-10-CM | POA: Diagnosis not present

## 2023-11-23 DIAGNOSIS — I672 Cerebral atherosclerosis: Secondary | ICD-10-CM | POA: Diagnosis not present

## 2023-11-23 DIAGNOSIS — R509 Fever, unspecified: Secondary | ICD-10-CM | POA: Diagnosis not present

## 2023-11-23 DIAGNOSIS — R001 Bradycardia, unspecified: Secondary | ICD-10-CM | POA: Diagnosis not present

## 2023-11-23 DIAGNOSIS — I444 Left anterior fascicular block: Secondary | ICD-10-CM | POA: Diagnosis not present

## 2023-11-23 DIAGNOSIS — Z781 Physical restraint status: Secondary | ICD-10-CM | POA: Diagnosis not present

## 2023-11-23 DIAGNOSIS — D631 Anemia in chronic kidney disease: Secondary | ICD-10-CM | POA: Diagnosis present

## 2023-11-23 DIAGNOSIS — I129 Hypertensive chronic kidney disease with stage 1 through stage 4 chronic kidney disease, or unspecified chronic kidney disease: Secondary | ICD-10-CM | POA: Diagnosis present

## 2023-11-23 DIAGNOSIS — T6701XS Heatstroke and sunstroke, sequela: Secondary | ICD-10-CM | POA: Diagnosis not present

## 2023-11-23 DIAGNOSIS — Z9911 Dependence on respirator [ventilator] status: Secondary | ICD-10-CM | POA: Diagnosis not present

## 2023-11-23 DIAGNOSIS — R000 Tachycardia, unspecified: Secondary | ICD-10-CM | POA: Diagnosis not present

## 2023-11-23 DIAGNOSIS — R4020 Unspecified coma: Secondary | ICD-10-CM | POA: Diagnosis not present

## 2023-11-23 DIAGNOSIS — I10 Essential (primary) hypertension: Secondary | ICD-10-CM | POA: Diagnosis not present

## 2023-11-23 DIAGNOSIS — I160 Hypertensive urgency: Secondary | ICD-10-CM | POA: Diagnosis not present

## 2023-11-23 DIAGNOSIS — I701 Atherosclerosis of renal artery: Secondary | ICD-10-CM | POA: Diagnosis not present

## 2023-11-23 DIAGNOSIS — R079 Chest pain, unspecified: Secondary | ICD-10-CM | POA: Diagnosis not present

## 2023-11-23 DIAGNOSIS — J9601 Acute respiratory failure with hypoxia: Secondary | ICD-10-CM | POA: Diagnosis present

## 2023-11-23 DIAGNOSIS — F01C0 Vascular dementia, severe, without behavioral disturbance, psychotic disturbance, mood disturbance, and anxiety: Secondary | ICD-10-CM | POA: Diagnosis not present

## 2023-11-23 DIAGNOSIS — R633 Feeding difficulties, unspecified: Secondary | ICD-10-CM | POA: Diagnosis not present

## 2023-11-23 DIAGNOSIS — N179 Acute kidney failure, unspecified: Secondary | ICD-10-CM | POA: Diagnosis not present

## 2023-11-23 DIAGNOSIS — J9811 Atelectasis: Secondary | ICD-10-CM | POA: Diagnosis not present

## 2023-11-23 DIAGNOSIS — I517 Cardiomegaly: Secondary | ICD-10-CM | POA: Diagnosis not present

## 2023-11-23 DIAGNOSIS — M47812 Spondylosis without myelopathy or radiculopathy, cervical region: Secondary | ICD-10-CM | POA: Diagnosis not present

## 2023-11-23 DIAGNOSIS — R680 Hypothermia, not associated with low environmental temperature: Secondary | ICD-10-CM | POA: Diagnosis not present

## 2023-11-23 DIAGNOSIS — F05 Delirium due to known physiological condition: Secondary | ICD-10-CM | POA: Diagnosis not present

## 2023-11-23 DIAGNOSIS — T6701XA Heatstroke and sunstroke, initial encounter: Secondary | ICD-10-CM | POA: Diagnosis present

## 2023-11-23 DIAGNOSIS — N189 Chronic kidney disease, unspecified: Secondary | ICD-10-CM | POA: Diagnosis present

## 2023-11-23 DIAGNOSIS — D649 Anemia, unspecified: Secondary | ICD-10-CM | POA: Diagnosis not present

## 2023-11-23 DIAGNOSIS — R918 Other nonspecific abnormal finding of lung field: Secondary | ICD-10-CM | POA: Diagnosis not present

## 2023-11-23 DIAGNOSIS — S199XXA Unspecified injury of neck, initial encounter: Secondary | ICD-10-CM | POA: Diagnosis not present

## 2023-11-23 DIAGNOSIS — Z515 Encounter for palliative care: Secondary | ICD-10-CM | POA: Diagnosis not present

## 2023-11-23 DIAGNOSIS — J9690 Respiratory failure, unspecified, unspecified whether with hypoxia or hypercapnia: Secondary | ICD-10-CM | POA: Diagnosis not present

## 2023-11-23 DIAGNOSIS — Z8673 Personal history of transient ischemic attack (TIA), and cerebral infarction without residual deficits: Secondary | ICD-10-CM | POA: Diagnosis not present

## 2023-11-23 DIAGNOSIS — Z720 Tobacco use: Secondary | ICD-10-CM | POA: Diagnosis not present

## 2023-11-23 DIAGNOSIS — R4189 Other symptoms and signs involving cognitive functions and awareness: Secondary | ICD-10-CM | POA: Diagnosis not present

## 2023-11-23 DIAGNOSIS — Z4682 Encounter for fitting and adjustment of non-vascular catheter: Secondary | ICD-10-CM | POA: Diagnosis not present

## 2023-11-23 DIAGNOSIS — I272 Pulmonary hypertension, unspecified: Secondary | ICD-10-CM | POA: Diagnosis not present

## 2023-11-23 DIAGNOSIS — Z743 Need for continuous supervision: Secondary | ICD-10-CM | POA: Diagnosis not present

## 2023-11-23 DIAGNOSIS — G928 Other toxic encephalopathy: Secondary | ICD-10-CM | POA: Diagnosis present

## 2023-11-23 DIAGNOSIS — I219 Acute myocardial infarction, unspecified: Secondary | ICD-10-CM | POA: Diagnosis not present

## 2023-11-23 DIAGNOSIS — E872 Acidosis, unspecified: Secondary | ICD-10-CM | POA: Diagnosis present

## 2023-11-23 DIAGNOSIS — D696 Thrombocytopenia, unspecified: Secondary | ICD-10-CM | POA: Diagnosis not present

## 2023-11-23 DIAGNOSIS — J90 Pleural effusion, not elsewhere classified: Secondary | ICD-10-CM | POA: Diagnosis not present

## 2023-11-23 DIAGNOSIS — E43 Unspecified severe protein-calorie malnutrition: Secondary | ICD-10-CM | POA: Diagnosis present

## 2023-11-23 DIAGNOSIS — G969 Disorder of central nervous system, unspecified: Secondary | ICD-10-CM | POA: Diagnosis not present

## 2023-11-23 DIAGNOSIS — R531 Weakness: Secondary | ICD-10-CM | POA: Diagnosis not present

## 2023-11-23 DIAGNOSIS — E878 Other disorders of electrolyte and fluid balance, not elsewhere classified: Secondary | ICD-10-CM | POA: Diagnosis present

## 2023-11-23 DIAGNOSIS — E860 Dehydration: Secondary | ICD-10-CM | POA: Diagnosis not present

## 2023-11-23 DIAGNOSIS — E876 Hypokalemia: Secondary | ICD-10-CM | POA: Diagnosis not present

## 2023-11-23 DIAGNOSIS — Z4659 Encounter for fitting and adjustment of other gastrointestinal appliance and device: Secondary | ICD-10-CM | POA: Diagnosis not present

## 2023-11-23 DIAGNOSIS — R9401 Abnormal electroencephalogram [EEG]: Secondary | ICD-10-CM | POA: Diagnosis not present

## 2023-11-23 DIAGNOSIS — Z681 Body mass index (BMI) 19 or less, adult: Secondary | ICD-10-CM | POA: Diagnosis not present

## 2023-11-23 DIAGNOSIS — I708 Atherosclerosis of other arteries: Secondary | ICD-10-CM | POA: Diagnosis not present

## 2023-11-23 DIAGNOSIS — R1310 Dysphagia, unspecified: Secondary | ICD-10-CM | POA: Diagnosis not present

## 2023-12-25 DIAGNOSIS — Z720 Tobacco use: Secondary | ICD-10-CM | POA: Diagnosis not present

## 2023-12-25 DIAGNOSIS — F01C0 Vascular dementia, severe, without behavioral disturbance, psychotic disturbance, mood disturbance, and anxiety: Secondary | ICD-10-CM | POA: Diagnosis not present

## 2023-12-25 DIAGNOSIS — R1312 Dysphagia, oropharyngeal phase: Secondary | ICD-10-CM | POA: Diagnosis not present

## 2023-12-25 DIAGNOSIS — Z681 Body mass index (BMI) 19 or less, adult: Secondary | ICD-10-CM | POA: Diagnosis not present

## 2023-12-25 DIAGNOSIS — T6701XS Heatstroke and sunstroke, sequela: Secondary | ICD-10-CM | POA: Diagnosis not present

## 2023-12-25 DIAGNOSIS — I672 Cerebral atherosclerosis: Secondary | ICD-10-CM | POA: Diagnosis not present

## 2023-12-25 DIAGNOSIS — G9341 Metabolic encephalopathy: Secondary | ICD-10-CM | POA: Diagnosis not present

## 2023-12-25 DIAGNOSIS — I10 Essential (primary) hypertension: Secondary | ICD-10-CM | POA: Diagnosis not present

## 2023-12-25 DIAGNOSIS — Z8673 Personal history of transient ischemic attack (TIA), and cerebral infarction without residual deficits: Secondary | ICD-10-CM | POA: Diagnosis not present

## 2023-12-25 DIAGNOSIS — J9601 Acute respiratory failure with hypoxia: Secondary | ICD-10-CM | POA: Diagnosis not present

## 2023-12-25 DIAGNOSIS — E43 Unspecified severe protein-calorie malnutrition: Secondary | ICD-10-CM | POA: Diagnosis not present

## 2023-12-27 DIAGNOSIS — I10 Essential (primary) hypertension: Secondary | ICD-10-CM | POA: Diagnosis not present

## 2023-12-27 DIAGNOSIS — F01C0 Vascular dementia, severe, without behavioral disturbance, psychotic disturbance, mood disturbance, and anxiety: Secondary | ICD-10-CM | POA: Diagnosis not present

## 2023-12-27 DIAGNOSIS — Z8673 Personal history of transient ischemic attack (TIA), and cerebral infarction without residual deficits: Secondary | ICD-10-CM | POA: Diagnosis not present

## 2023-12-27 DIAGNOSIS — E43 Unspecified severe protein-calorie malnutrition: Secondary | ICD-10-CM | POA: Diagnosis not present

## 2023-12-27 DIAGNOSIS — I672 Cerebral atherosclerosis: Secondary | ICD-10-CM | POA: Diagnosis not present

## 2023-12-27 DIAGNOSIS — J9601 Acute respiratory failure with hypoxia: Secondary | ICD-10-CM | POA: Diagnosis not present

## 2023-12-29 DIAGNOSIS — Z8673 Personal history of transient ischemic attack (TIA), and cerebral infarction without residual deficits: Secondary | ICD-10-CM | POA: Diagnosis not present

## 2023-12-29 DIAGNOSIS — F01C0 Vascular dementia, severe, without behavioral disturbance, psychotic disturbance, mood disturbance, and anxiety: Secondary | ICD-10-CM | POA: Diagnosis not present

## 2023-12-29 DIAGNOSIS — I672 Cerebral atherosclerosis: Secondary | ICD-10-CM | POA: Diagnosis not present

## 2023-12-29 DIAGNOSIS — I10 Essential (primary) hypertension: Secondary | ICD-10-CM | POA: Diagnosis not present

## 2023-12-29 DIAGNOSIS — J9601 Acute respiratory failure with hypoxia: Secondary | ICD-10-CM | POA: Diagnosis not present

## 2023-12-29 DIAGNOSIS — E43 Unspecified severe protein-calorie malnutrition: Secondary | ICD-10-CM | POA: Diagnosis not present

## 2024-01-04 DIAGNOSIS — I672 Cerebral atherosclerosis: Secondary | ICD-10-CM | POA: Diagnosis not present

## 2024-01-04 DIAGNOSIS — I10 Essential (primary) hypertension: Secondary | ICD-10-CM | POA: Diagnosis not present

## 2024-01-04 DIAGNOSIS — F01C0 Vascular dementia, severe, without behavioral disturbance, psychotic disturbance, mood disturbance, and anxiety: Secondary | ICD-10-CM | POA: Diagnosis not present

## 2024-01-04 DIAGNOSIS — Z8673 Personal history of transient ischemic attack (TIA), and cerebral infarction without residual deficits: Secondary | ICD-10-CM | POA: Diagnosis not present

## 2024-01-04 DIAGNOSIS — J9601 Acute respiratory failure with hypoxia: Secondary | ICD-10-CM | POA: Diagnosis not present

## 2024-01-04 DIAGNOSIS — E43 Unspecified severe protein-calorie malnutrition: Secondary | ICD-10-CM | POA: Diagnosis not present

## 2024-01-05 DIAGNOSIS — Z8673 Personal history of transient ischemic attack (TIA), and cerebral infarction without residual deficits: Secondary | ICD-10-CM | POA: Diagnosis not present

## 2024-01-05 DIAGNOSIS — E43 Unspecified severe protein-calorie malnutrition: Secondary | ICD-10-CM | POA: Diagnosis not present

## 2024-01-05 DIAGNOSIS — F01C0 Vascular dementia, severe, without behavioral disturbance, psychotic disturbance, mood disturbance, and anxiety: Secondary | ICD-10-CM | POA: Diagnosis not present

## 2024-01-05 DIAGNOSIS — I10 Essential (primary) hypertension: Secondary | ICD-10-CM | POA: Diagnosis not present

## 2024-01-05 DIAGNOSIS — J9601 Acute respiratory failure with hypoxia: Secondary | ICD-10-CM | POA: Diagnosis not present

## 2024-01-05 DIAGNOSIS — I672 Cerebral atherosclerosis: Secondary | ICD-10-CM | POA: Diagnosis not present

## 2024-01-06 DIAGNOSIS — I672 Cerebral atherosclerosis: Secondary | ICD-10-CM | POA: Diagnosis not present

## 2024-01-06 DIAGNOSIS — J9601 Acute respiratory failure with hypoxia: Secondary | ICD-10-CM | POA: Diagnosis not present

## 2024-01-06 DIAGNOSIS — E43 Unspecified severe protein-calorie malnutrition: Secondary | ICD-10-CM | POA: Diagnosis not present

## 2024-01-06 DIAGNOSIS — Z8673 Personal history of transient ischemic attack (TIA), and cerebral infarction without residual deficits: Secondary | ICD-10-CM | POA: Diagnosis not present

## 2024-01-06 DIAGNOSIS — I10 Essential (primary) hypertension: Secondary | ICD-10-CM | POA: Diagnosis not present

## 2024-01-06 DIAGNOSIS — F01C0 Vascular dementia, severe, without behavioral disturbance, psychotic disturbance, mood disturbance, and anxiety: Secondary | ICD-10-CM | POA: Diagnosis not present

## 2024-01-10 DIAGNOSIS — J9601 Acute respiratory failure with hypoxia: Secondary | ICD-10-CM | POA: Diagnosis not present

## 2024-01-10 DIAGNOSIS — F01C0 Vascular dementia, severe, without behavioral disturbance, psychotic disturbance, mood disturbance, and anxiety: Secondary | ICD-10-CM | POA: Diagnosis not present

## 2024-01-10 DIAGNOSIS — E43 Unspecified severe protein-calorie malnutrition: Secondary | ICD-10-CM | POA: Diagnosis not present

## 2024-01-10 DIAGNOSIS — I672 Cerebral atherosclerosis: Secondary | ICD-10-CM | POA: Diagnosis not present

## 2024-01-10 DIAGNOSIS — I10 Essential (primary) hypertension: Secondary | ICD-10-CM | POA: Diagnosis not present

## 2024-01-10 DIAGNOSIS — Z8673 Personal history of transient ischemic attack (TIA), and cerebral infarction without residual deficits: Secondary | ICD-10-CM | POA: Diagnosis not present

## 2024-01-12 DIAGNOSIS — E43 Unspecified severe protein-calorie malnutrition: Secondary | ICD-10-CM | POA: Diagnosis not present

## 2024-01-12 DIAGNOSIS — F01C0 Vascular dementia, severe, without behavioral disturbance, psychotic disturbance, mood disturbance, and anxiety: Secondary | ICD-10-CM | POA: Diagnosis not present

## 2024-01-12 DIAGNOSIS — J9601 Acute respiratory failure with hypoxia: Secondary | ICD-10-CM | POA: Diagnosis not present

## 2024-01-12 DIAGNOSIS — Z8673 Personal history of transient ischemic attack (TIA), and cerebral infarction without residual deficits: Secondary | ICD-10-CM | POA: Diagnosis not present

## 2024-01-12 DIAGNOSIS — I10 Essential (primary) hypertension: Secondary | ICD-10-CM | POA: Diagnosis not present

## 2024-01-12 DIAGNOSIS — I672 Cerebral atherosclerosis: Secondary | ICD-10-CM | POA: Diagnosis not present

## 2024-01-19 DIAGNOSIS — F01C0 Vascular dementia, severe, without behavioral disturbance, psychotic disturbance, mood disturbance, and anxiety: Secondary | ICD-10-CM | POA: Diagnosis not present

## 2024-01-19 DIAGNOSIS — I10 Essential (primary) hypertension: Secondary | ICD-10-CM | POA: Diagnosis not present

## 2024-01-19 DIAGNOSIS — E43 Unspecified severe protein-calorie malnutrition: Secondary | ICD-10-CM | POA: Diagnosis not present

## 2024-01-19 DIAGNOSIS — J9601 Acute respiratory failure with hypoxia: Secondary | ICD-10-CM | POA: Diagnosis not present

## 2024-01-19 DIAGNOSIS — Z8673 Personal history of transient ischemic attack (TIA), and cerebral infarction without residual deficits: Secondary | ICD-10-CM | POA: Diagnosis not present

## 2024-01-19 DIAGNOSIS — I672 Cerebral atherosclerosis: Secondary | ICD-10-CM | POA: Diagnosis not present

## 2024-01-24 DIAGNOSIS — F01C0 Vascular dementia, severe, without behavioral disturbance, psychotic disturbance, mood disturbance, and anxiety: Secondary | ICD-10-CM | POA: Diagnosis not present

## 2024-01-24 DIAGNOSIS — G9341 Metabolic encephalopathy: Secondary | ICD-10-CM | POA: Diagnosis not present

## 2024-01-24 DIAGNOSIS — J9601 Acute respiratory failure with hypoxia: Secondary | ICD-10-CM | POA: Diagnosis not present

## 2024-01-24 DIAGNOSIS — T6701XS Heatstroke and sunstroke, sequela: Secondary | ICD-10-CM | POA: Diagnosis not present

## 2024-01-24 DIAGNOSIS — R1312 Dysphagia, oropharyngeal phase: Secondary | ICD-10-CM | POA: Diagnosis not present

## 2024-01-24 DIAGNOSIS — Z8673 Personal history of transient ischemic attack (TIA), and cerebral infarction without residual deficits: Secondary | ICD-10-CM | POA: Diagnosis not present

## 2024-01-24 DIAGNOSIS — Z681 Body mass index (BMI) 19 or less, adult: Secondary | ICD-10-CM | POA: Diagnosis not present

## 2024-01-24 DIAGNOSIS — Z720 Tobacco use: Secondary | ICD-10-CM | POA: Diagnosis not present

## 2024-01-24 DIAGNOSIS — I10 Essential (primary) hypertension: Secondary | ICD-10-CM | POA: Diagnosis not present

## 2024-01-24 DIAGNOSIS — I672 Cerebral atherosclerosis: Secondary | ICD-10-CM | POA: Diagnosis not present

## 2024-01-24 DIAGNOSIS — E43 Unspecified severe protein-calorie malnutrition: Secondary | ICD-10-CM | POA: Diagnosis not present

## 2024-01-26 DIAGNOSIS — E43 Unspecified severe protein-calorie malnutrition: Secondary | ICD-10-CM | POA: Diagnosis not present

## 2024-01-26 DIAGNOSIS — I672 Cerebral atherosclerosis: Secondary | ICD-10-CM | POA: Diagnosis not present

## 2024-01-26 DIAGNOSIS — I10 Essential (primary) hypertension: Secondary | ICD-10-CM | POA: Diagnosis not present

## 2024-01-26 DIAGNOSIS — F01C0 Vascular dementia, severe, without behavioral disturbance, psychotic disturbance, mood disturbance, and anxiety: Secondary | ICD-10-CM | POA: Diagnosis not present

## 2024-01-26 DIAGNOSIS — J9601 Acute respiratory failure with hypoxia: Secondary | ICD-10-CM | POA: Diagnosis not present

## 2024-01-26 DIAGNOSIS — Z8673 Personal history of transient ischemic attack (TIA), and cerebral infarction without residual deficits: Secondary | ICD-10-CM | POA: Diagnosis not present

## 2024-02-02 DIAGNOSIS — J9601 Acute respiratory failure with hypoxia: Secondary | ICD-10-CM | POA: Diagnosis not present

## 2024-02-02 DIAGNOSIS — E43 Unspecified severe protein-calorie malnutrition: Secondary | ICD-10-CM | POA: Diagnosis not present

## 2024-02-02 DIAGNOSIS — Z8673 Personal history of transient ischemic attack (TIA), and cerebral infarction without residual deficits: Secondary | ICD-10-CM | POA: Diagnosis not present

## 2024-02-02 DIAGNOSIS — I672 Cerebral atherosclerosis: Secondary | ICD-10-CM | POA: Diagnosis not present

## 2024-02-02 DIAGNOSIS — I10 Essential (primary) hypertension: Secondary | ICD-10-CM | POA: Diagnosis not present

## 2024-02-02 DIAGNOSIS — F01C0 Vascular dementia, severe, without behavioral disturbance, psychotic disturbance, mood disturbance, and anxiety: Secondary | ICD-10-CM | POA: Diagnosis not present

## 2024-02-03 DIAGNOSIS — F01C0 Vascular dementia, severe, without behavioral disturbance, psychotic disturbance, mood disturbance, and anxiety: Secondary | ICD-10-CM | POA: Diagnosis not present

## 2024-02-03 DIAGNOSIS — J9601 Acute respiratory failure with hypoxia: Secondary | ICD-10-CM | POA: Diagnosis not present

## 2024-02-03 DIAGNOSIS — Z8673 Personal history of transient ischemic attack (TIA), and cerebral infarction without residual deficits: Secondary | ICD-10-CM | POA: Diagnosis not present

## 2024-02-03 DIAGNOSIS — E43 Unspecified severe protein-calorie malnutrition: Secondary | ICD-10-CM | POA: Diagnosis not present

## 2024-02-03 DIAGNOSIS — I672 Cerebral atherosclerosis: Secondary | ICD-10-CM | POA: Diagnosis not present

## 2024-02-03 DIAGNOSIS — I10 Essential (primary) hypertension: Secondary | ICD-10-CM | POA: Diagnosis not present

## 2024-02-09 DIAGNOSIS — I10 Essential (primary) hypertension: Secondary | ICD-10-CM | POA: Diagnosis not present

## 2024-02-09 DIAGNOSIS — E43 Unspecified severe protein-calorie malnutrition: Secondary | ICD-10-CM | POA: Diagnosis not present

## 2024-02-09 DIAGNOSIS — I672 Cerebral atherosclerosis: Secondary | ICD-10-CM | POA: Diagnosis not present

## 2024-02-09 DIAGNOSIS — Z8673 Personal history of transient ischemic attack (TIA), and cerebral infarction without residual deficits: Secondary | ICD-10-CM | POA: Diagnosis not present

## 2024-02-09 DIAGNOSIS — J9601 Acute respiratory failure with hypoxia: Secondary | ICD-10-CM | POA: Diagnosis not present

## 2024-02-09 DIAGNOSIS — F01C0 Vascular dementia, severe, without behavioral disturbance, psychotic disturbance, mood disturbance, and anxiety: Secondary | ICD-10-CM | POA: Diagnosis not present

## 2024-02-16 DIAGNOSIS — I672 Cerebral atherosclerosis: Secondary | ICD-10-CM | POA: Diagnosis not present

## 2024-02-16 DIAGNOSIS — J9601 Acute respiratory failure with hypoxia: Secondary | ICD-10-CM | POA: Diagnosis not present

## 2024-02-16 DIAGNOSIS — E43 Unspecified severe protein-calorie malnutrition: Secondary | ICD-10-CM | POA: Diagnosis not present

## 2024-02-16 DIAGNOSIS — I10 Essential (primary) hypertension: Secondary | ICD-10-CM | POA: Diagnosis not present

## 2024-02-16 DIAGNOSIS — F01C0 Vascular dementia, severe, without behavioral disturbance, psychotic disturbance, mood disturbance, and anxiety: Secondary | ICD-10-CM | POA: Diagnosis not present

## 2024-02-16 DIAGNOSIS — Z8673 Personal history of transient ischemic attack (TIA), and cerebral infarction without residual deficits: Secondary | ICD-10-CM | POA: Diagnosis not present

## 2024-02-17 DIAGNOSIS — Z8673 Personal history of transient ischemic attack (TIA), and cerebral infarction without residual deficits: Secondary | ICD-10-CM | POA: Diagnosis not present

## 2024-02-17 DIAGNOSIS — I672 Cerebral atherosclerosis: Secondary | ICD-10-CM | POA: Diagnosis not present

## 2024-02-17 DIAGNOSIS — E43 Unspecified severe protein-calorie malnutrition: Secondary | ICD-10-CM | POA: Diagnosis not present

## 2024-02-17 DIAGNOSIS — J9601 Acute respiratory failure with hypoxia: Secondary | ICD-10-CM | POA: Diagnosis not present

## 2024-02-17 DIAGNOSIS — F01C0 Vascular dementia, severe, without behavioral disturbance, psychotic disturbance, mood disturbance, and anxiety: Secondary | ICD-10-CM | POA: Diagnosis not present

## 2024-02-17 DIAGNOSIS — I10 Essential (primary) hypertension: Secondary | ICD-10-CM | POA: Diagnosis not present

## 2024-02-23 DIAGNOSIS — Z8673 Personal history of transient ischemic attack (TIA), and cerebral infarction without residual deficits: Secondary | ICD-10-CM | POA: Diagnosis not present

## 2024-02-23 DIAGNOSIS — E43 Unspecified severe protein-calorie malnutrition: Secondary | ICD-10-CM | POA: Diagnosis not present

## 2024-02-23 DIAGNOSIS — I10 Essential (primary) hypertension: Secondary | ICD-10-CM | POA: Diagnosis not present

## 2024-02-23 DIAGNOSIS — I672 Cerebral atherosclerosis: Secondary | ICD-10-CM | POA: Diagnosis not present

## 2024-02-23 DIAGNOSIS — F01C0 Vascular dementia, severe, without behavioral disturbance, psychotic disturbance, mood disturbance, and anxiety: Secondary | ICD-10-CM | POA: Diagnosis not present

## 2024-02-23 DIAGNOSIS — J9601 Acute respiratory failure with hypoxia: Secondary | ICD-10-CM | POA: Diagnosis not present

## 2024-02-24 DIAGNOSIS — Z8673 Personal history of transient ischemic attack (TIA), and cerebral infarction without residual deficits: Secondary | ICD-10-CM | POA: Diagnosis not present

## 2024-02-24 DIAGNOSIS — Z720 Tobacco use: Secondary | ICD-10-CM | POA: Diagnosis not present

## 2024-02-24 DIAGNOSIS — J9601 Acute respiratory failure with hypoxia: Secondary | ICD-10-CM | POA: Diagnosis not present

## 2024-02-24 DIAGNOSIS — F01C0 Vascular dementia, severe, without behavioral disturbance, psychotic disturbance, mood disturbance, and anxiety: Secondary | ICD-10-CM | POA: Diagnosis not present

## 2024-02-24 DIAGNOSIS — T6701XS Heatstroke and sunstroke, sequela: Secondary | ICD-10-CM | POA: Diagnosis not present

## 2024-02-24 DIAGNOSIS — G9341 Metabolic encephalopathy: Secondary | ICD-10-CM | POA: Diagnosis not present

## 2024-02-24 DIAGNOSIS — E43 Unspecified severe protein-calorie malnutrition: Secondary | ICD-10-CM | POA: Diagnosis not present

## 2024-02-24 DIAGNOSIS — Z681 Body mass index (BMI) 19 or less, adult: Secondary | ICD-10-CM | POA: Diagnosis not present

## 2024-02-24 DIAGNOSIS — R1312 Dysphagia, oropharyngeal phase: Secondary | ICD-10-CM | POA: Diagnosis not present

## 2024-02-24 DIAGNOSIS — I10 Essential (primary) hypertension: Secondary | ICD-10-CM | POA: Diagnosis not present

## 2024-02-24 DIAGNOSIS — I672 Cerebral atherosclerosis: Secondary | ICD-10-CM | POA: Diagnosis not present

## 2024-02-26 DIAGNOSIS — I672 Cerebral atherosclerosis: Secondary | ICD-10-CM | POA: Diagnosis not present

## 2024-02-26 DIAGNOSIS — J9601 Acute respiratory failure with hypoxia: Secondary | ICD-10-CM | POA: Diagnosis not present

## 2024-02-26 DIAGNOSIS — I10 Essential (primary) hypertension: Secondary | ICD-10-CM | POA: Diagnosis not present

## 2024-02-26 DIAGNOSIS — E43 Unspecified severe protein-calorie malnutrition: Secondary | ICD-10-CM | POA: Diagnosis not present

## 2024-02-26 DIAGNOSIS — Z8673 Personal history of transient ischemic attack (TIA), and cerebral infarction without residual deficits: Secondary | ICD-10-CM | POA: Diagnosis not present

## 2024-02-26 DIAGNOSIS — F01C0 Vascular dementia, severe, without behavioral disturbance, psychotic disturbance, mood disturbance, and anxiety: Secondary | ICD-10-CM | POA: Diagnosis not present

## 2024-02-27 DIAGNOSIS — E43 Unspecified severe protein-calorie malnutrition: Secondary | ICD-10-CM | POA: Diagnosis not present

## 2024-02-27 DIAGNOSIS — F01C0 Vascular dementia, severe, without behavioral disturbance, psychotic disturbance, mood disturbance, and anxiety: Secondary | ICD-10-CM | POA: Diagnosis not present

## 2024-02-27 DIAGNOSIS — I672 Cerebral atherosclerosis: Secondary | ICD-10-CM | POA: Diagnosis not present

## 2024-02-27 DIAGNOSIS — J9601 Acute respiratory failure with hypoxia: Secondary | ICD-10-CM | POA: Diagnosis not present

## 2024-02-27 DIAGNOSIS — I10 Essential (primary) hypertension: Secondary | ICD-10-CM | POA: Diagnosis not present

## 2024-02-27 DIAGNOSIS — Z8673 Personal history of transient ischemic attack (TIA), and cerebral infarction without residual deficits: Secondary | ICD-10-CM | POA: Diagnosis not present

## 2024-02-28 DIAGNOSIS — J9601 Acute respiratory failure with hypoxia: Secondary | ICD-10-CM | POA: Diagnosis not present

## 2024-02-28 DIAGNOSIS — Z8673 Personal history of transient ischemic attack (TIA), and cerebral infarction without residual deficits: Secondary | ICD-10-CM | POA: Diagnosis not present

## 2024-02-28 DIAGNOSIS — I672 Cerebral atherosclerosis: Secondary | ICD-10-CM | POA: Diagnosis not present

## 2024-02-28 DIAGNOSIS — I10 Essential (primary) hypertension: Secondary | ICD-10-CM | POA: Diagnosis not present

## 2024-02-28 DIAGNOSIS — E43 Unspecified severe protein-calorie malnutrition: Secondary | ICD-10-CM | POA: Diagnosis not present

## 2024-02-28 DIAGNOSIS — F01C0 Vascular dementia, severe, without behavioral disturbance, psychotic disturbance, mood disturbance, and anxiety: Secondary | ICD-10-CM | POA: Diagnosis not present

## 2024-02-29 DIAGNOSIS — J9601 Acute respiratory failure with hypoxia: Secondary | ICD-10-CM | POA: Diagnosis not present

## 2024-02-29 DIAGNOSIS — I10 Essential (primary) hypertension: Secondary | ICD-10-CM | POA: Diagnosis not present

## 2024-02-29 DIAGNOSIS — Z8673 Personal history of transient ischemic attack (TIA), and cerebral infarction without residual deficits: Secondary | ICD-10-CM | POA: Diagnosis not present

## 2024-02-29 DIAGNOSIS — F01C0 Vascular dementia, severe, without behavioral disturbance, psychotic disturbance, mood disturbance, and anxiety: Secondary | ICD-10-CM | POA: Diagnosis not present

## 2024-02-29 DIAGNOSIS — I672 Cerebral atherosclerosis: Secondary | ICD-10-CM | POA: Diagnosis not present

## 2024-02-29 DIAGNOSIS — E43 Unspecified severe protein-calorie malnutrition: Secondary | ICD-10-CM | POA: Diagnosis not present

## 2024-03-26 DEATH — deceased
